# Patient Record
Sex: Female | Born: 1960 | Race: White | Hispanic: No | Marital: Married | Smoking: Former smoker
Health system: Southern US, Community
[De-identification: ages and names within clinical notes are randomized; demographics above are authoritative.]

## PROBLEM LIST (undated history)

## (undated) DIAGNOSIS — G4700 Insomnia, unspecified: Secondary | ICD-10-CM

## (undated) DIAGNOSIS — I499 Cardiac arrhythmia, unspecified: Secondary | ICD-10-CM

## (undated) DIAGNOSIS — R002 Palpitations: Secondary | ICD-10-CM

## (undated) DIAGNOSIS — J302 Other seasonal allergic rhinitis: Secondary | ICD-10-CM

## (undated) HISTORY — DX: Palpitations: R00.2

## (undated) HISTORY — PX: CHOLECYSTECTOMY: SHX55

## (undated) HISTORY — PX: TONSILLECTOMY: SUR1361

## (undated) HISTORY — PX: WISDOM TOOTH EXTRACTION: SHX21

## (undated) HISTORY — PX: OTHER SURGICAL HISTORY: SHX169

## (undated) HISTORY — DX: Cardiac arrhythmia, unspecified: I49.9

---

## 2011-07-29 DIAGNOSIS — I499 Cardiac arrhythmia, unspecified: Secondary | ICD-10-CM

## 2011-07-29 HISTORY — DX: Cardiac arrhythmia, unspecified: I49.9

## 2011-08-02 ENCOUNTER — Ambulatory Visit (INDEPENDENT_AMBULATORY_CARE_PROVIDER_SITE_OTHER): Payer: 59 | Admitting: Surgery

## 2011-08-06 ENCOUNTER — Ambulatory Visit (INDEPENDENT_AMBULATORY_CARE_PROVIDER_SITE_OTHER): Payer: 59 | Admitting: Surgery

## 2011-08-16 ENCOUNTER — Ambulatory Visit: Payer: Self-pay | Admitting: Internal Medicine

## 2011-08-17 ENCOUNTER — Emergency Department (HOSPITAL_BASED_OUTPATIENT_CLINIC_OR_DEPARTMENT_OTHER)
Admission: EM | Admit: 2011-08-17 | Discharge: 2011-08-18 | Disposition: A | Discharge status: Home / Self Care | Payer: 59 | Attending: Emergency Medicine | Admitting: Emergency Medicine

## 2011-08-17 ENCOUNTER — Emergency Department (INDEPENDENT_AMBULATORY_CARE_PROVIDER_SITE_OTHER): Payer: 59

## 2011-08-17 ENCOUNTER — Encounter (HOSPITAL_BASED_OUTPATIENT_CLINIC_OR_DEPARTMENT_OTHER): Payer: Self-pay | Admitting: Student

## 2011-08-17 DIAGNOSIS — R42 Dizziness and giddiness: Secondary | ICD-10-CM | POA: Insufficient documentation

## 2011-08-17 DIAGNOSIS — E860 Dehydration: Secondary | ICD-10-CM

## 2011-08-17 DIAGNOSIS — R002 Palpitations: Secondary | ICD-10-CM | POA: Insufficient documentation

## 2011-08-17 DIAGNOSIS — I10 Essential (primary) hypertension: Secondary | ICD-10-CM

## 2011-08-17 DIAGNOSIS — E876 Hypokalemia: Secondary | ICD-10-CM

## 2011-08-17 DIAGNOSIS — Z79899 Other long term (current) drug therapy: Secondary | ICD-10-CM | POA: Insufficient documentation

## 2011-08-17 DIAGNOSIS — R5381 Other malaise: Secondary | ICD-10-CM

## 2011-08-17 DIAGNOSIS — I951 Orthostatic hypotension: Secondary | ICD-10-CM

## 2011-08-17 DIAGNOSIS — R110 Nausea: Secondary | ICD-10-CM | POA: Insufficient documentation

## 2011-08-17 HISTORY — DX: Insomnia, unspecified: G47.00

## 2011-08-17 HISTORY — DX: Other seasonal allergic rhinitis: J30.2

## 2011-08-17 LAB — BASIC METABOLIC PANEL
BUN: 7 mg/dL (ref 6–23)
Chloride: 103 mEq/L (ref 96–112)
GFR calc Af Amer: >90 mL/min (ref >90)
GFR calc non Af Amer: >90 mL/min (ref >90)
Potassium: 3 mEq/L — ABNORMAL LOW (ref 3.5–5.1)
Sodium: 137 mEq/L (ref 135–145)

## 2011-08-17 LAB — URINALYSIS, ROUTINE W REFLEX MICROSCOPIC
Bilirubin Urine: NEGATIVE
Glucose, UA: NEGATIVE mg/dL
Hgb urine dipstick: NEGATIVE
Ketones, ur: NEGATIVE mg/dL
Leukocytes, UA: NEGATIVE
Nitrite: NEGATIVE
Protein, ur: NEGATIVE mg/dL
Specific Gravity, Urine: 1.007 (ref 1.005–1.030)
Urobilinogen, UA: 0.2 mg/dL (ref 0.0–1.0)
pH: 6 (ref 5.0–8.0)

## 2011-08-17 LAB — DIFFERENTIAL
Basophils Absolute: 0 10*3/uL (ref 0.0–0.1)
Basophils Relative: 0 % (ref 0–1)
Eosinophils Absolute: 0 10*3/uL (ref 0.0–0.7)
Eosinophils Relative: 0 % (ref 0–5)
Lymphocytes Relative: 8 % — ABNORMAL LOW (ref 12–46)
Lymphs Abs: 0.6 K/uL — ABNORMAL LOW (ref 0.7–4.0)
Monocytes Absolute: 0.5 K/uL (ref 0.1–1.0)
Monocytes Relative: 6 % (ref 3–12)
Neutro Abs: 7.2 10*3/uL (ref 1.7–7.7)
Neutrophils Relative %: 86 % — ABNORMAL HIGH (ref 43–77)

## 2011-08-17 LAB — BASIC METABOLIC PANEL WITH GFR
CO2: 24 meq/L (ref 19–32)
Calcium: 8.7 mg/dL (ref 8.4–10.5)
Creatinine, Ser: 0.5 mg/dL (ref 0.50–1.10)
Glucose, Bld: 114 mg/dL — ABNORMAL HIGH (ref 70–99)

## 2011-08-17 LAB — CBC
HCT: 44.3 % (ref 36.0–46.0)
Hemoglobin: 15.8 g/dL — ABNORMAL HIGH (ref 12.0–15.0)
MCH: 31.8 pg (ref 26.0–34.0)
MCHC: 35.7 g/dL (ref 30.0–36.0)
MCV: 89.1 fL (ref 78.0–100.0)
Platelets: 205 10*3/uL (ref 150–400)
RBC: 4.97 MIL/uL (ref 3.87–5.11)
RDW: 13.2 % (ref 11.5–15.5)
WBC: 8.3 K/uL (ref 4.0–10.5)

## 2011-08-17 LAB — TROPONIN I: Troponin I: <0.3 ng/mL (ref <0.30)

## 2011-08-17 LAB — PREGNANCY, URINE: Preg Test, Ur: NEGATIVE

## 2011-08-17 MED ORDER — ONDANSETRON HCL 4 MG/2ML IJ SOLN
4.0000 mg | Freq: Once | INTRAMUSCULAR | Status: AC
  Administered 2011-08-17: 4 mg via INTRAVENOUS
  Filled 2011-08-17: qty 2

## 2011-08-17 MED ORDER — POTASSIUM CHLORIDE IN NACL 20-0.9 MEQ/L-% IV SOLN
Freq: Once | INTRAVENOUS | Status: AC
  Administered 2011-08-17: 23:00:00 via INTRAVENOUS
  Filled 2011-08-17: qty 1000

## 2011-08-17 MED ORDER — SODIUM CHLORIDE 0.9 % IV BOLUS (SEPSIS)
1000.0000 mL | Freq: Once | INTRAVENOUS | Status: AC
  Administered 2011-08-17: 1000 mL via INTRAVENOUS

## 2011-08-17 MED ORDER — POTASSIUM CHLORIDE CRYS ER 20 MEQ PO TBCR
40.0000 meq | EXTENDED_RELEASE_TABLET | Freq: Once | ORAL | Status: AC
  Administered 2011-08-17: 40 meq via ORAL
  Filled 2011-08-17: qty 2

## 2011-08-17 NOTE — ED Notes (Signed)
MD at bedside. 

## 2011-08-17 NOTE — ED Provider Notes (Signed)
History     CSN: [id number]  Arrival date & time 08/17/11  7829   First MD Initiated Contact with Patient 08/17/11 2129      Chief Complaint  Patient presents with  . Nausea  . Palpitations  . 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 She reports that she woke up this morning with heart palpitations, occasional nausea and lightheadedness. She denies fever or chills. She denies chest pain or pleurisy. She denies sore throat, coughing. She denies any vomiting or diarrhea. She notes that she has had some irregular menstrual bleeding and thinks that she might be going through menopause. She does see a OB/GYN. She is currently on birth control pills to help regulate her regular menses. She notes 3 weeks ago she had 2 weeks of continual bleeding. This has since resolved. She reports she continues to feel lightheaded with heart palpitations currently. She denies lower extremity swelling or pain. She denies long distance travel recently. She denies prior history of heart problems. She is a nonsmoker and denies any family history of coronary disease. She does take the Zyrtec and Benadryl for a combination of seasonal allergies as well as insomnia.  Patient is a 51 y.o. female presenting with palpitations. The history is provided by the patient and the spouse.  Palpitations  Associated symptoms include nausea. Pertinent negatives include no fever, no chest pain, no vomiting, no back pain, no cough and no shortness of breath.    Past Medical History  Diagnosis Date  . Seasonal allergies   . Insomnia     Past Surgical History  Procedure Date  . Cholecystectomy     History reviewed. No pertinent family history.  History  Substance Use Topics  . Smoking status: Former Smoker    Types: Cigarettes    Quit date: 08/20/1991  . Smokeless tobacco: Never Used    . Alcohol Use: Yes    OB History    Grav Para Term Preterm Abortions TAB SAB Ect Mult Living                  Review of Systems  Constitutional: Negative.  Negative for fever and chills.  HENT: Negative for nosebleeds and congestion.   Respiratory: Negative for cough, chest tightness, shortness of breath and wheezing.   Cardiovascular: Positive for palpitations. Negative for chest pain and leg swelling.  Gastrointestinal: Positive for nausea. Negative for vomiting.  Genitourinary: Negative for flank pain.  Musculoskeletal: Negative for back pain.  Neurological: Positive for light-headedness. Negative for syncope.  All other systems reviewed and are negative.    Allergies  Penicillins  Home Medications   Current Outpatient Rx  Name Route Sig Dispense Refill  . CETIRIZINE HCL 10 MG PO TABS Oral Take 10 mg by mouth daily.    Marland Kitchen DIPHENHYDRAMINE HCL 25 MG PO TABS Oral Take 50 mg by mouth at bedtime as needed. For sleep    . LEVONORGESTREL-ETHINYL ESTRAD 0.1-20 MG-MCG PO TABS Oral Take 1 tablet by mouth daily.    . ADULT MULTIVITAMIN W/MINERALS CH Oral Take 1 tablet by mouth daily.    Marland Kitchen NAPROXEN SODIUM 220 MG PO TABS Oral Take 440 mg by mouth once as needed. For pain    . FISH OIL PO Oral Take 400 mg by mouth daily.    Marland Kitchen OVER THE COUNTER MEDICATION Oral Take 1 tablet by mouth once. antacid      BP 110/66  Pulse 92  Temp(Src) 98.6 F (37 C) (Oral)  Resp 20  Ht 5\' 6"  (1.676 m)  Wt 155 lb (70.308 kg)  BMI 25.02 kg/m2  SpO2 98%  LMP 08/03/2011  Physical Exam  Nursing note and vitals reviewed. Constitutional: She is oriented to person, place, and time. She appears well-developed and well-nourished. No distress.  HENT:  Head: Normocephalic and atraumatic.  Eyes: EOM are normal. Pupils are equal, round, and reactive to light.  Neck: Normal range of motion. Neck supple. No JVD present. No tracheal deviation present. No thyromegaly present.  Cardiovascular: Exam reveals no  gallop.   No murmur heard. Pulmonary/Chest: Effort normal. No respiratory distress. She has no wheezes. She has no rales.  Abdominal: Soft. Bowel sounds are normal.  Neurological: She is alert and oriented to person, place, and time.  Skin: Skin is warm and dry. No rash noted.  Psychiatric: She has a normal mood and affect.    ED Course  Procedures (including critical care time)  Labs Reviewed  CBC - Abnormal; Notable for the following:    Hemoglobin 15.8 (*)    All other components within normal limits  DIFFERENTIAL - Abnormal; Notable for the following:    Neutrophils Relative 86 (*)    Lymphocytes Relative 8 (*)    Lymphs Abs 0.6 (*)    All other components within normal limits  BASIC METABOLIC PANEL - Abnormal; Notable for the following:    Potassium 3.0 (*)    Glucose, Bld 114 (*)    All other components within normal limits  PREGNANCY, URINE  URINALYSIS, ROUTINE W REFLEX MICROSCOPIC  TROPONIN I  TSH   No results found.   1. Orthostasis   2. Hypokalemia   3. Dehydration     ECG at time 19:49 shows sinus tachycardia at rate 104, normal axis, normal intervals.  Non specific T wave inversions inferiorly and laterally.     12:30 AM, pt receiving IV NS with K+ and repeat EKG pending.  Pt signed out to Dr. Bebe Shaggy for re-assessment.    MDM  Pt's orthostatics are positive.  Pt did have 2 loose stools while here.  Hgb is elevated suggesting hemoconcentration and dehydration but also associated hypokalemia with K+ of 3.0.  This likely can explain her symptoms along with non specific ECG changes.  Pt had no pleurisy, CP, doubt CAD, doubt PE with neg Denna Haggard' no recent travel, RA sat of 98%.  Will replace K+ and recheck vital signs and ECG.  1 L of IVF'a already given.  Otherwise troponin and other electrolytes are WNL.  TSH was sent, can be followed up as outpt.  Pt has been set up for an outpt appt with Dr. Lenord Fellers to establish as new patient.          Gavin Pound. Rada Zegers,  MD 08/20/11 1507

## 2011-08-17 NOTE — ED Notes (Signed)
Pt in with c/o N dizziness, lightheadedness, palpitations.

## 2011-08-17 NOTE — ED Notes (Signed)
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 States she woke with nausea and a fast heart rate. Went to work but has not felt well all day. Irregular menses. Thinks her thyroid levels may be abnormal.

## 2011-08-17 NOTE — Discharge Instructions (Signed)
Hypokalemia Hypokalemia means a low potassium level in the blood.Potassium is an electrolyte that helps regulate the amount of fluid in the body. It also stimulates muscle contraction and maintains a stable acid-base balance.Most of the body's potassium is inside of cells, and only a very small amount is in the blood. Because the amount in the blood is so small, minor changes can have big effects. PREPARATION FOR TEST Testing for potassium requires taking a blood sample taken by needle from a vein in the arm. The skin is cleaned thoroughly before the sample is drawn. There is no other special preparation needed. NORMAL VALUES Potassium levels below 3.5 mEq/L are abnormally low. Levels above 5.1 mEq/L are abnormally high. Ranges for normal findings may vary among different laboratories and hospitals. You should always check with your doctor after having lab work or other tests done to discuss the meaning of your test results and whether your values are considered within normal limits. MEANING OF TEST  Your caregiver will go over the test results with you and discuss the importance and meaning of your results, as well as treatment options and the need for additional tests, if necessary. A potassium level is frequently part of a routine medical exam. It is usually included as part of a whole "panel" of tests for several blood salts (such as Sodium and Chloride). It may be done as part of follow-up when a low potassium level was found in the past or other blood salts are suspected of being out of balance. A low potassium level might be suspected if you have one or more of the following:  Symptoms of weakness.   Abnormal heart rhythms.   High blood pressure and are taking medication to control this, especially water pills (diuretics).   Kidney disease that can affect your potassium level .   Diabetes requiring the use of insulin. The potassium may fall after taking insulin, especially if the  diabetes had been out of control for a while.   A condition requiring the use of cortisone-type medication or certain types of antibiotics.   Vomiting and/or diarrhea for more than a day or two.   A stomach or intestinal condition that may not permit appropriate absorption of potassium.   Fainting episodes.   Mental confusion.  OBTAINING TEST RESULTS It is your responsibility to obtain your test results. Ask the lab or department performing the test when and how you will get your results.  Please contact your caregiver directly if you have not received the results within one week. At that time, ask if there is anything different or new you should be doing in relation to the results. TREATMENT Hypokalemia can be treated with potassium supplements taken by mouth and/or adjustments in your current medications. A diet high in potassium is also helpful. Foods with high potassium content are:  Peas, lentils, lima beans, nuts, and dried fruit.   Whole grain and bran cereals and breads.   Fresh fruit, vegetables (bananas, cantaloupe, grapefruit, oranges, tomatoes, honeydew melons, potatoes).   Orange and tomato juices.   Meats. If potassium supplement has been prescribed for you today or your medications have been adjusted, see your personal caregiver in time02 for a re-check.  SEEK MEDICAL CARE IF:  There is a feeling of worsening weakness.   You experience repeated chest palpitations.   You are diabetic and having difficulty keeping your blood sugars in the normal range.   You are experiencing vomiting and/or diarrhea.   You are having  difficulty with any of your regular medications.  SEEK IMMEDIATE MEDICAL CARE IF:  You experience chest pain, shortness of breath, or episodes of dizziness.   You have been having vomiting or diarrhea for more than 2 days.   You have a fainting episode.  MAKE SURE YOU:   Understand these instructions.   Will watch your condition.   Will  get help right away if you are not doing well or get worse.  Document Released: 04/05/2005 Document Revised: 03/25/2011 Document Reviewed: 03/16/2008 [facility] Patient Information 2012 [place], [place].     Orthostatic Hypotension Orthostatic hypotension is a sudden fall in blood pressure. It occurs when a person goes from a sitting or lying position to a standing position. CAUSES   Loss of body fluids (dehydration).   Medicines that lower blood pressure.   Sudden changes in posture, such as sudden standing when you have been sitting or lying down.   Taking too much of your medicine.  SYMPTOMS   Lightheadedness or dizziness.   Fainting or near-fainting.   A fast heart rate (tachycardia).   Weakness.   Feeling tired (fatigue).  DIAGNOSIS  Your caregiver may find the cause of orthostatic hypotension through:  A history and/or physical exam.   Checking your blood pressure. Your caregiver will check your blood pressure when you are:   Lying down.   Sitting.   Standing.   Tilt table testing. In this test, you are placed on a table that goes from a lying position to a standing position. You will be strapped to the table. This test helps to monitor your blood pressure and heart rate when you are in different positions.  TREATMENT   If orthostatic hypotension is caused by your medicines, your caregiver will need to adjust your dosage. Do not stop or adjust your medicine on your own.   When changing positions, make these changes slowly. This allows your body to adjust to the different position.   Compression stockings that are worn on your lower legs may be helpful.   Your caregiver may have you consume extra salt. Do not add extra salt to your diet unless directed by your caregiver.   Eat frequent, small meals. Avoid sudden standing after eating.   Avoid hot showers or excessive heat.   Your caregiver may give you fluids through the vein (intravenous).   Your  caregiver may put you on medicine to help enhance fluid retention.  SEEK IMMEDIATE MEDICAL CARE IF:   You faint or have a near-fainting episode. Call your local emergency services (911 in [place]   You have or develop chest pain.   You feel sick to your stomach (nauseous) or vomit.   You have a loss of feeling or movement in your arms or legs.   You have difficulty talking, slurred speech, or you are unable to talk.   You have difficulty thinking or have confused thinking.  MAKE SURE YOU:   Understand these instructions.   Will watch your condition.   Will get help right away if you are not doing well or get worse.  Document Released: 03/26/2002 Document Revised: 03/25/2011 Document Reviewed: 07/19/2008 [facility] Patient Information 2012 [place], [place].

## 2011-08-18 LAB — TSH: TSH: 0.746 u[IU]/mL (ref 0.350–4.500)

## 2011-08-18 MED ORDER — POTASSIUM CHLORIDE IN NACL 20-0.9 MEQ/L-% IV SOLN: Freq: Once | INTRAVENOUS | Status: DC

## 2011-08-18 MED ORDER — KETOROLAC TROMETHAMINE 30 MG/ML IJ SOLN
INTRAMUSCULAR | Status: AC
  Filled 2011-08-18: qty 1

## 2011-08-18 MED ORDER — KETOROLAC TROMETHAMINE 30 MG/ML IJ SOLN
30.0000 mg | Freq: Once | INTRAMUSCULAR | Status: AC
  Administered 2011-08-18: 30 mg via INTRAVENOUS

## 2011-08-18 NOTE — ED Provider Notes (Signed)
Pt improved Ambulatory, vitals improved Repeat EKG reviewed, some improvement in t waves She has no complaints Stable for d/c Labs reviewed BP 110/66  Pulse 92  Temp(Src) 98.6 F (37 C) (Oral)  Resp 20  Ht 5\' 6"  (1.676 m)  Wt 155 lb (70.308 kg)  BMI 25.02 kg/m2  SpO2 98%  LMP 08/03/2011   Joya Gaskins, MD 08/18/11 [phone]

## 2011-08-20 ENCOUNTER — Other Ambulatory Visit: Payer: Self-pay | Admitting: Internal Medicine

## 2011-08-20 ENCOUNTER — Encounter: Payer: Self-pay | Admitting: Internal Medicine

## 2011-08-20 ENCOUNTER — Ambulatory Visit (INDEPENDENT_AMBULATORY_CARE_PROVIDER_SITE_OTHER): Payer: 59 | Admitting: Internal Medicine

## 2011-08-20 VITALS — BP 116/84 | HR 72 | Temp 98.9°F | Ht 65.5 in | Wt 152.0 lb

## 2011-08-20 DIAGNOSIS — F411 Generalized anxiety disorder: Secondary | ICD-10-CM

## 2011-08-20 DIAGNOSIS — R110 Nausea: Secondary | ICD-10-CM

## 2011-08-20 DIAGNOSIS — A084 Viral intestinal infection, unspecified: Secondary | ICD-10-CM

## 2011-08-20 DIAGNOSIS — F419 Anxiety disorder, unspecified: Secondary | ICD-10-CM

## 2011-08-20 DIAGNOSIS — E876 Hypokalemia: Secondary | ICD-10-CM

## 2011-08-20 DIAGNOSIS — J309 Allergic rhinitis, unspecified: Secondary | ICD-10-CM

## 2011-08-20 DIAGNOSIS — R002 Palpitations: Secondary | ICD-10-CM

## 2011-08-20 LAB — CBC WITH DIFFERENTIAL/PLATELET
Basophils Absolute: 0 10*3/uL (ref 0.0–0.1)
Eosinophils Relative: 1 % (ref 0–5)
Lymphocytes Relative: 17 % (ref 12–46)
Lymphs Abs: 1.1 10*3/uL (ref 0.7–4.0)
Neutro Abs: 4.9 10*3/uL (ref 1.7–7.7)
Neutrophils Relative %: 75 % (ref 43–77)
Platelets: 234 10*3/uL (ref 150–400)
RBC: 4.91 MIL/uL (ref 3.87–5.11)
RDW: 13.3 % (ref 11.5–15.5)
WBC: 6.5 10*3/uL (ref 4.0–10.5)

## 2011-08-20 LAB — COMPREHENSIVE METABOLIC PANEL
ALT: 38 U/L — ABNORMAL HIGH (ref 0–35)
AST: 34 U/L (ref 0–37)
CO2: 26 mEq/L (ref 19–32)
Calcium: 9.4 mg/dL (ref 8.4–10.5)
Chloride: 104 mEq/L (ref 96–112)
Creat: 0.68 mg/dL (ref 0.50–1.10)
Sodium: 140 mEq/L (ref 135–145)
Total Bilirubin: 0.3 mg/dL (ref 0.3–1.2)
Total Protein: 6.9 g/dL (ref 6.0–8.3)

## 2011-08-20 LAB — AMYLASE: Amylase: 51 U/L (ref 0–105)

## 2011-08-20 LAB — T4, FREE: Free T4: 1.04 ng/dL (ref 0.80–1.80)

## 2011-08-20 LAB — HEMOGLOBIN A1C: Hgb A1c MFr Bld: 5.2 % (ref <5.7)

## 2011-08-23 ENCOUNTER — Encounter: Payer: Self-pay | Admitting: Internal Medicine

## 2011-08-23 ENCOUNTER — Ambulatory Visit (INDEPENDENT_AMBULATORY_CARE_PROVIDER_SITE_OTHER): Payer: 59 | Admitting: Internal Medicine

## 2011-08-23 VITALS — BP 128/86 | HR 76 | Temp 99.5°F | Wt 153.5 lb

## 2011-08-23 DIAGNOSIS — A09 Infectious gastroenteritis and colitis, unspecified: Secondary | ICD-10-CM

## 2011-08-23 DIAGNOSIS — R002 Palpitations: Secondary | ICD-10-CM

## 2011-08-23 DIAGNOSIS — D179 Benign lipomatous neoplasm, unspecified: Secondary | ICD-10-CM

## 2011-08-23 DIAGNOSIS — A084 Viral intestinal infection, unspecified: Secondary | ICD-10-CM

## 2011-08-23 LAB — H. PYLORI ANTIBODY, IGG: H Pylori IgG: 0.5 {ISR}

## 2011-08-24 ENCOUNTER — Encounter: Payer: Self-pay | Admitting: Internal Medicine

## 2011-08-24 DIAGNOSIS — R002 Palpitations: Secondary | ICD-10-CM | POA: Insufficient documentation

## 2011-08-24 DIAGNOSIS — D179 Benign lipomatous neoplasm, unspecified: Secondary | ICD-10-CM | POA: Insufficient documentation

## 2011-08-24 NOTE — Patient Instructions (Signed)
Call if symptoms recur. Otherwise return as needed.

## 2011-08-24 NOTE — Progress Notes (Signed)
  Subjective:    Patient ID: Margaret Bass, female    DOB: 1960/10/08, 51 y.o.   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 Patient says today that she had to use denies her dog recently which was very stressful. On the day she was seen in the emergency department she wakened feeling not well at all. She was sluggish all day and apparently developed a tachycardia around 120 beats per minute. She with to the emergency department and was diagnosed with hypokalemia and was given some IV fluids potassium supplementation. She went home and developed multiple episodes of diarrhea. Had nausea but no vomiting. She also has approximate 3 cm soft nodule on right lower rib cage area which she describes as needed. No other lesions like this. Only noticed it a few weeks ago. Became alarmed. She and her [place]. EKG done in the office on May 3 was normal. Also had some vague epigastric discomfort at the same time she was experiencing the palpitations.    Review of Systems     Objective:   Physical ExamChest clear to auscultation; cardiac exam regular rate and rhythm normal S1 and S2 with no murmurs clicks or friction rub. extremities without edema. 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        Assessment & Plan:Palpitations-etiology unclear  Bile gastroenteritis-resolved  Palpitations  Palpitations  Hypokalemia-etiology unclear. Repeat serum potassium on May 3 is within normal limits.  Probable viral gastroenteritis  Lipoma right anterior rib cage area  Plan: Patient is to return for reassessment of lipoma in a few weeks. I believe this is a benign lesion. If she has recurrent palpitations, we can refer her to cardiologist.  Reviewed with her recent lab work. TSH is normal. Urine pregnancy test  negative. Urinalysis was normal. Amylase, lipase, H. pylori antibody negative.

## 2011-09-18 NOTE — Progress Notes (Signed)
  Subjective:    Patient ID: Margaret Bass, female    DOB: 07/26/1960, 51 y.o.   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 Patient did not feel well at work. Began to feel very fatigued for some unknown reason. Was given IV fluids in the emergency department and sent home. Later developed gastroenteritis symptoms. CBC was within normal limits. She was afebrile in the emergency department. She was complaining of nausea and palpitations at the time. It is unclear why she was hypokalemic. Diagnosis was made before onset of diarrhea. She had no fever or shaking chills. Patient was very alarmed by all of this. In  Patient has history of allergic rhinitis. Had fractured clavicle 1979; cholecystectomy 1981; she is allergic to penicillin it causes times. She's had 3 pregnancies and no miscarriages. Take Zyrtec for allergic rhinitis. Takes Benadryl to sleep. Is on oral contraceptives. Patient had tetanus immunization in 2009. 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 Husband is employed by [employer].  Family history: Father with history of diabetes and hypertension. Mother with history of hypertension.. One brother with hypertension and one sister in good health. 3 adult children.      Review of Systems patient is very anxious about all of this and concerned about possible recurrence. Concerned about possibility of heart condition with palpitations. Doesn't understand why she was diagnosed with hypokalemia. Otherwise feeling better at present time. Initial ER visit was on 08/17/2011.     Objective:   Physical Exam HEENT exam: TMs and pharynx are clear; neck is supple without thyromegaly, adenopathy. Chest is clear to auscultation. Cardiac exam regular rate and rhythm normal S1 and S2.  Abdomen no hepatosplenomegaly masses or tenderness. Extremities without edema. EKG today shows sinus rhythm rate 68, normal EKG.        Assessment & Plan:  Recent episode of nausea and palpitations with subsequent gastroenteritis symptoms. Suspect patient was coming down with viral gastroenteritis.  Suspect palpitations were benign  Hypokalemic  diagnosed in emergency department 0n August 17 2011 with no obvious gastroenteritis symptoms at that time. Had not vomited or had diarrhea prior to being seen. Wonder if this could be an error. She does not take diuretics.  Suspect anxiety disorder. Patient seems anxious.  Plan: Repeat serum potassium today. Check thyroid functions. EKG is within normal limits. Patient return for followup in 3 days.

## 2011-09-19 DIAGNOSIS — F419 Anxiety disorder, unspecified: Secondary | ICD-10-CM | POA: Insufficient documentation

## 2011-09-19 DIAGNOSIS — J309 Allergic rhinitis, unspecified: Secondary | ICD-10-CM | POA: Insufficient documentation

## 2011-09-19 NOTE — Patient Instructions (Signed)
Stay well hydrated and return in 3 days for reevaluation. Patient reassured. She will call if symptoms return.

## 2011-09-23 ENCOUNTER — Ambulatory Visit (INDEPENDENT_AMBULATORY_CARE_PROVIDER_SITE_OTHER): Payer: 59 | Admitting: Internal Medicine

## 2011-09-23 VITALS — BP 110/84 | HR 80 | Temp 99.2°F | Wt 153.0 lb

## 2011-09-23 DIAGNOSIS — D1739 Benign lipomatous neoplasm of skin and subcutaneous tissue of other sites: Secondary | ICD-10-CM

## 2011-09-23 DIAGNOSIS — D171 Benign lipomatous neoplasm of skin and subcutaneous tissue of trunk: Secondary | ICD-10-CM

## 2011-09-23 NOTE — Patient Instructions (Signed)
Return next Monday for physical exam or sooner if necessary

## 2011-10-04 ENCOUNTER — Encounter: Payer: Self-pay | Admitting: Internal Medicine

## 2011-10-04 NOTE — Progress Notes (Signed)
  Subjective:    Patient ID: Margaret Bass, female    DOB: August 06, 1960, 51 y.o.   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 She's had no recurrence of these symptoms whatsoever. Feels well now. At last visit she showed me a 3 cm lipoma right abdomen. She is back to followup on this. It does not appear to have changed in size or consistency.   Review of Systems     Objective:   Physical Exam 3 cm lipoma right anterior abdominal wall        Assessment & Plan:  Lipoma-continue to observe for change in size  Plan: Return as needed

## 2012-11-07 ENCOUNTER — Ambulatory Visit: Payer: Self-pay | Admitting: Nurse Practitioner

## 2012-11-15 ENCOUNTER — Other Ambulatory Visit: Payer: Self-pay | Admitting: Internal Medicine

## 2012-11-15 LAB — COMPREHENSIVE METABOLIC PANEL
CO2: 24 mEq/L (ref 19–32)
Calcium: 9.3 mg/dL (ref 8.4–10.5)
Chloride: 102 mEq/L (ref 96–112)
Creat: 0.6 mg/dL (ref 0.50–1.10)
Glucose, Bld: 84 mg/dL (ref 70–99)
Total Bilirubin: 0.6 mg/dL (ref 0.3–1.2)

## 2012-11-15 LAB — LIPID PANEL
Cholesterol: 177 mg/dL (ref 0–200)
Total CHOL/HDL Ratio: 3.8 Ratio
Triglycerides: 182 mg/dL — ABNORMAL HIGH (ref <150)
VLDL: 36 mg/dL (ref 0–40)

## 2012-11-15 LAB — CBC WITH DIFFERENTIAL/PLATELET
Eosinophils Absolute: 0.1 10*3/uL (ref 0.0–0.7)
Eosinophils Relative: 1 % (ref 0–5)
HCT: 42.2 % (ref 36.0–46.0)
Hemoglobin: 14.9 g/dL (ref 12.0–15.0)
Lymphs Abs: 1.6 10*3/uL (ref 0.7–4.0)
MCH: 31.6 pg (ref 26.0–34.0)
MCV: 89.4 fL (ref 78.0–100.0)
Monocytes Absolute: 0.6 10*3/uL (ref 0.1–1.0)
Monocytes Relative: 8 % (ref 3–12)
RBC: 4.72 MIL/uL (ref 3.87–5.11)

## 2012-11-23 ENCOUNTER — Other Ambulatory Visit: Payer: Self-pay | Admitting: Internal Medicine

## 2012-11-24 ENCOUNTER — Other Ambulatory Visit: Payer: Self-pay | Admitting: Internal Medicine

## 2012-11-27 ENCOUNTER — Encounter: Payer: Self-pay | Admitting: Internal Medicine

## 2012-11-27 ENCOUNTER — Ambulatory Visit (INDEPENDENT_AMBULATORY_CARE_PROVIDER_SITE_OTHER): Payer: 59 | Admitting: Internal Medicine

## 2012-11-27 ENCOUNTER — Other Ambulatory Visit (HOSPITAL_COMMUNITY)
Admission: RE | Admit: 2012-11-27 | Discharge: 2012-11-27 | Disposition: A | Discharge status: Home / Self Care | Payer: 59 | Source: Ambulatory Visit | Attending: Internal Medicine | Admitting: Internal Medicine

## 2012-11-27 VITALS — BP 138/88 | HR 80 | Temp 99.1°F | Ht 65.5 in | Wt 159.5 lb

## 2012-11-27 DIAGNOSIS — N912 Amenorrhea, unspecified: Secondary | ICD-10-CM

## 2012-11-27 DIAGNOSIS — N951 Menopausal and female climacteric states: Secondary | ICD-10-CM

## 2012-11-27 DIAGNOSIS — J309 Allergic rhinitis, unspecified: Secondary | ICD-10-CM

## 2012-11-27 DIAGNOSIS — Z0000 Encounter for general adult medical examination without abnormal findings: Secondary | ICD-10-CM

## 2012-11-27 DIAGNOSIS — Z01419 Encounter for gynecological examination (general) (routine) without abnormal findings: Secondary | ICD-10-CM | POA: Insufficient documentation

## 2012-11-27 LAB — POCT URINALYSIS DIPSTICK
Bilirubin, UA: NEGATIVE
Blood, UA: NEGATIVE
Glucose, UA: NEGATIVE
Nitrite, UA: NEGATIVE
Spec Grav, UA: 1.01
Urobilinogen, UA: NEGATIVE

## 2012-11-28 LAB — FOLLICLE STIMULATING HORMONE: FSH: 2.4 m[IU]/mL

## 2012-12-18 ENCOUNTER — Encounter: Payer: Self-pay | Admitting: Internal Medicine

## 2012-12-18 NOTE — Progress Notes (Signed)
  Subjective:    Patient ID: Margaret Bass, female    DOB: [birth year], 52 y.o.   MRN: [id number]  HPI    52 year old white female in today for health maintenance and evaluation of medical problems. 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 She and her husband moved here from [place] in 2013.  Social history: Married. Husband is employed at [employer]. 3 adult children.  Family history: Father with history of diabetes and hypertension. Mother with history of hypertension. One brother with hypertension. One sister in good health.  Past medical history: History of allergic rhinitis. History of fractured clavicle 1979, cholecystectomy 1981. She's had 3 pregnancies and no miscarriages.  Is allergic to penicillin that causes hives.  Medications: Takes Zyrtec for allergic rhinitis. Takes Benadryl to sleep. Is on oral contraceptives.  Had tetanus immunization in 2009.    Review of Systems  Constitutional: Negative.   HENT: Negative.   Eyes: Negative.   Respiratory: Negative.   Cardiovascular: Negative.   Gastrointestinal: Negative.   Endocrine: Negative.   Genitourinary: Negative.   Allergic/Immunologic: Positive for environmental allergies.  Neurological: Negative.   Hematological: Negative.   Psychiatric/Behavioral: Negative.        Objective:   Physical Exam  Vitals reviewed. Constitutional: She is oriented to person, place, and time. She appears well-developed and well-nourished. No distress.  HENT:  Head: Normocephalic.  Right Ear: External ear normal.  Left Ear: External ear normal.  Mouth/Throat: Oropharynx is clear and moist. No oropharyngeal exudate.  Eyes: Conjunctivae and EOM are normal. Pupils are equal, round, and reactive to light. Right eye exhibits no discharge. Left eye exhibits no discharge. No scleral icterus.  Neck: Neck supple. No JVD present. No thyromegaly present.  Cardiovascular: Normal rate, regular rhythm and normal heart  sounds.   No murmur heard. Pulmonary/Chest: Effort normal and breath sounds normal. No respiratory distress. She has no wheezes. She has no rales.  Abdominal: She exhibits no distension and no mass. There is no tenderness. There is no rebound and no guarding.  Musculoskeletal: Normal range of motion. She exhibits no edema.  Neurological: She is alert and oriented to person, place, and time. No cranial nerve deficit. Coordination normal.  Skin: Skin is warm and dry. No rash noted. She is not diaphoretic.  Psychiatric: She has a normal mood and affect. Her behavior is normal. Judgment and thought content normal.          Assessment & Plan:  Normal health maintenance exam  History of allergic rhinitis  Has had some amenorrhea. Is on oral contraceptives. Could be Perimenopausal.  FSH done.  Plan: Return in one year or as needed. Spoke with patient about screening colonoscopy.

## 2012-12-18 NOTE — Patient Instructions (Addendum)
Return in one year or as needed. Consider colonoscopy.

## 2013-01-25 ENCOUNTER — Other Ambulatory Visit: Payer: Self-pay

## 2013-01-25 MED ORDER — NORETHIN-ETH ESTRAD-FE BIPHAS 1 MG-10 MCG / 10 MCG PO TABS: 1.0000 | ORAL_TABLET | Freq: Every day | ORAL | Status: DC

## 2013-03-22 ENCOUNTER — Encounter: Payer: Self-pay | Admitting: Internal Medicine

## 2013-03-22 ENCOUNTER — Ambulatory Visit (INDEPENDENT_AMBULATORY_CARE_PROVIDER_SITE_OTHER): Payer: 59 | Admitting: Internal Medicine

## 2013-03-22 VITALS — BP 130/84 | HR 68 | Temp 99.1°F | Ht 65.0 in | Wt 160.0 lb

## 2013-03-22 DIAGNOSIS — M5430 Sciatica, unspecified side: Secondary | ICD-10-CM

## 2013-03-22 DIAGNOSIS — M5431 Sciatica, right side: Secondary | ICD-10-CM

## 2013-03-22 MED ORDER — VALACYCLOVIR HCL 500 MG PO TABS: ORAL_TABLET | ORAL | Status: DC

## 2013-03-22 MED ORDER — CYCLOBENZAPRINE HCL 10 MG PO TABS: ORAL_TABLET | ORAL | Status: DC

## 2013-03-30 ENCOUNTER — Telehealth: Payer: Self-pay | Admitting: Internal Medicine

## 2013-03-30 ENCOUNTER — Other Ambulatory Visit: Payer: Self-pay | Admitting: *Deleted

## 2013-03-30 ENCOUNTER — Encounter: Payer: Self-pay | Admitting: Internal Medicine

## 2013-03-30 DIAGNOSIS — M5430 Sciatica, unspecified side: Secondary | ICD-10-CM

## 2013-03-30 MED ORDER — METHYLPREDNISOLONE 4 MG PO TABS: ORAL_TABLET | ORAL | Status: DC

## 2013-03-30 NOTE — Progress Notes (Signed)
   Subjective:    Patient ID: Margaret Bass, female    DOB: 10-12-1960, 52 y.o.   MRN: [id number]  HPI Patient in today complaining of back pain radiating down into the right posterior upper thigh area. Says she's had this before. Doesn't know how she strained her back. Says Celebrex as worked well in the past but she does not have any at the present time. Talked briefly about placing her on oral steroids. Doesn't think she needs steroids she says.    Review of Systems     Objective:   Physical Exam Straight leg raising is negative at 90 bilaterally. Deep tendon reflexes 2+ and symmetrical in the lower extremities bilaterally. Muscle strength is normal in the lower extremities bilaterally.       Assessment & Plan:  Right sciatica  Plan: I have samples of Celebrex 200 mg daily for her to take for 2 weeks. Call if symptoms persist.  20 minutes spent with patient

## 2013-03-30 NOTE — Patient Instructions (Signed)
Takes Celebrex 200 mg daily. May need to have physical therapy. Call if symptoms persist.

## 2013-03-30 NOTE — Telephone Encounter (Signed)
Patient called saying back pain had not improved on Celebrex. Wants to try steroids.: Medrol 4 mg 6 day dosepak. Must not take Celebrex at the same time she is taking Medrol. If not getting better after course of Medrol, will need return office visit.

## 2014-01-16 ENCOUNTER — Other Ambulatory Visit: Payer: Self-pay | Admitting: Internal Medicine

## 2014-01-16 NOTE — Telephone Encounter (Signed)
Pt has not had CPE since August 2014. Needs PE torefill OCP. We can give her a couple of months IF she will book CPE

## 2014-01-16 NOTE — Telephone Encounter (Signed)
Spoke with patient regarding her OCP refill.  She scheduled a CPE and rx sent to pharmacy.

## 2014-02-14 ENCOUNTER — Encounter: Payer: Self-pay | Admitting: Internal Medicine

## 2014-02-20 ENCOUNTER — Encounter: Payer: Self-pay | Admitting: Internal Medicine

## 2014-03-26 ENCOUNTER — Encounter: Payer: Self-pay | Admitting: Internal Medicine

## 2014-03-29 ENCOUNTER — Other Ambulatory Visit: Payer: Self-pay | Admitting: Internal Medicine

## 2014-03-29 ENCOUNTER — Other Ambulatory Visit: Payer: 59 | Admitting: Internal Medicine

## 2014-03-29 DIAGNOSIS — Z1329 Encounter for screening for other suspected endocrine disorder: Secondary | ICD-10-CM

## 2014-03-29 DIAGNOSIS — Z1321 Encounter for screening for nutritional disorder: Secondary | ICD-10-CM

## 2014-03-29 DIAGNOSIS — Z130 Encounter for screening for diseases of the blood and blood-forming organs and certain disorders involving the immune mechanism: Secondary | ICD-10-CM

## 2014-03-29 DIAGNOSIS — Z0000 Encounter for general adult medical examination without abnormal findings: Secondary | ICD-10-CM

## 2014-03-29 DIAGNOSIS — Z13220 Encounter for screening for lipoid disorders: Secondary | ICD-10-CM

## 2014-03-29 LAB — COMPREHENSIVE METABOLIC PANEL
ALK PHOS: 47 U/L (ref 39–117)
ALT: 13 U/L (ref 0–35)
AST: 19 U/L (ref 0–37)
Albumin: 4.1 g/dL (ref 3.5–5.2)
BILIRUBIN TOTAL: 0.4 mg/dL (ref 0.2–1.2)
BUN: 10 mg/dL (ref 6–23)
CO2: 17 mEq/L — ABNORMAL LOW (ref 19–32)
CREATININE: 0.71 mg/dL (ref 0.50–1.10)
Calcium: 9.2 mg/dL (ref 8.4–10.5)
Chloride: 105 mEq/L (ref 96–112)
Glucose, Bld: 78 mg/dL (ref 70–99)
Potassium: 4.3 mEq/L (ref 3.5–5.3)
Sodium: 138 mEq/L (ref 135–145)
Total Protein: 6.6 g/dL (ref 6.0–8.3)

## 2014-03-29 LAB — CBC WITH DIFFERENTIAL/PLATELET
BASOS PCT: 0 % (ref 0–1)
Basophils Absolute: 0 10*3/uL (ref 0.0–0.1)
EOS ABS: 0.1 10*3/uL (ref 0.0–0.7)
EOS PCT: 2 % (ref 0–5)
HCT: 41.7 % (ref 36.0–46.0)
HEMOGLOBIN: 14.5 g/dL (ref 12.0–15.0)
LYMPHS ABS: 1.7 10*3/uL (ref 0.7–4.0)
Lymphocytes Relative: 26 % (ref 12–46)
MCH: 30.7 pg (ref 26.0–34.0)
MCHC: 34.8 g/dL (ref 30.0–36.0)
MCV: 88.3 fL (ref 78.0–100.0)
MPV: 12.3 fL (ref 9.4–12.4)
Monocytes Absolute: 0.6 10*3/uL (ref 0.1–1.0)
Monocytes Relative: 9 % (ref 3–12)
Neutro Abs: 4.1 10*3/uL (ref 1.7–7.7)
Neutrophils Relative %: 63 % (ref 43–77)
Platelets: 264 10*3/uL (ref 150–400)
RBC: 4.72 MIL/uL (ref 3.87–5.11)
RDW: 13.8 % (ref 11.5–15.5)
WBC: 6.5 10*3/uL (ref 4.0–10.5)

## 2014-03-29 LAB — LIPID PANEL
CHOL/HDL RATIO: 3.3 ratio
CHOLESTEROL: 171 mg/dL (ref 0–200)
HDL: 52 mg/dL (ref >39)
LDL CALC: 95 mg/dL (ref 0–99)
TRIGLYCERIDES: 122 mg/dL (ref <150)
VLDL: 24 mg/dL (ref 0–40)

## 2014-03-29 LAB — TSH: TSH: 0.927 u[IU]/mL (ref 0.350–4.500)

## 2014-03-30 LAB — VITAMIN D 25 HYDROXY (VIT D DEFICIENCY, FRACTURES): VIT D 25 HYDROXY: 36 ng/mL (ref 30–100)

## 2014-04-01 ENCOUNTER — Encounter: Payer: Self-pay | Admitting: Internal Medicine

## 2014-04-01 ENCOUNTER — Telehealth: Payer: Self-pay

## 2014-04-01 ENCOUNTER — Ambulatory Visit (INDEPENDENT_AMBULATORY_CARE_PROVIDER_SITE_OTHER): Payer: 59 | Admitting: Internal Medicine

## 2014-04-01 VITALS — BP 148/84 | HR 84 | Temp 98.7°F | Ht 65.0 in | Wt 160.0 lb

## 2014-04-01 DIAGNOSIS — R002 Palpitations: Secondary | ICD-10-CM

## 2014-04-01 DIAGNOSIS — N951 Menopausal and female climacteric states: Secondary | ICD-10-CM | POA: Diagnosis not present

## 2014-04-01 DIAGNOSIS — Z0000 Encounter for general adult medical examination without abnormal findings: Secondary | ICD-10-CM | POA: Diagnosis not present

## 2014-04-01 DIAGNOSIS — IMO0001 Reserved for inherently not codable concepts without codable children: Secondary | ICD-10-CM

## 2014-04-01 DIAGNOSIS — R030 Elevated blood-pressure reading, without diagnosis of hypertension: Secondary | ICD-10-CM | POA: Diagnosis not present

## 2014-04-01 LAB — POCT URINALYSIS DIPSTICK
BILIRUBIN UA: NEGATIVE
Blood, UA: NEGATIVE
Glucose, UA: NEGATIVE
KETONES UA: NEGATIVE
LEUKOCYTES UA: NEGATIVE
NITRITE UA: NEGATIVE
Protein, UA: NEGATIVE
Spec Grav, UA: 1.01
Urobilinogen, UA: NEGATIVE
pH, UA: 6.5

## 2014-04-01 MED ORDER — NORETHIN-ETH ESTRAD-FE BIPHAS 1 MG-10 MCG / 10 MCG PO TABS: 1.0000 | ORAL_TABLET | Freq: Every day | ORAL | Status: DC

## 2014-04-01 NOTE — Progress Notes (Signed)
Subjective:    Patient ID: Margaret Bass, female    DOB: 1961-02-08, 53 y.o.   MRN: [id number]  HPI  53 year old White Female in today for health maintenance exam. Has lipoma right trunk which has not changed in size and is not painful. She tells me her blood pressure has been up at times in the 140 range but not consistently. She is on oral contraceptives to manage heavy menses. She tried coming off about 6 months ago and had bad cramps and heavy bleeding. Sometimes she does skip a period despite taking oral contraceptives. Suspect she is perimenopausal. Sometimes feels warm at night. She is to purchase a home blood pressure cuff and keep track of her blood pressure. Occasionally at night will have palpitations but realizes it's related to either caffeine or decongestant consumption.  Past medical history: History of allergic rhinitis. History of fractured clavicle 1979. Cholecystectomy 1981. 3 pregnancies and no miscarriages.  Social history: Married. Husband is employed by [employer]. 3 adult children. 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 She'll her husband moved here from [place] in 2013.  Family history: Father with history of diabetes and hypertension. Mother with history of hypertension. One brother with hypertension. One sister in good health.  Is allergic to penicillin-causes hives.  Medications: Takes Zyrtec for allergic rhinitis and Benadryl to sleep. Is on oral contraceptives for heavy menses.  Had tetanus immunization in 2009.    Review of Systems  Constitutional: Negative.   HENT: Negative.   Respiratory: Negative.   Cardiovascular: Positive for palpitations.  Endocrine: Negative.   Allergic/Immunologic: Positive for environmental allergies.  Neurological: Negative.   Hematological: Negative.        Objective:   Physical Exam  Constitutional: She is oriented to person, place, and time. She appears well-developed and well-nourished.  No distress.  HENT:  Head: Normocephalic and atraumatic.  Right Ear: External ear normal.  Left Ear: External ear normal.  Mouth/Throat: Oropharynx is clear and moist. No oropharyngeal exudate.  Eyes: Conjunctivae and EOM are normal. Pupils are equal, round, and reactive to light. Right eye exhibits no discharge. Left eye exhibits no discharge. No scleral icterus.  Neck: Neck supple. No JVD present. No thyromegaly present.  Cardiovascular: Normal rate, regular rhythm, normal heart sounds and intact distal pulses.   No murmur heard. Pulmonary/Chest: Effort normal and breath sounds normal. No respiratory distress. She has no rales. She exhibits no tenderness.  Abdominal: Bowel sounds are normal. She exhibits no distension and no mass. There is no rebound and no guarding.  Genitourinary:  Pap taken last year. Bimanual normal.  Musculoskeletal: Normal range of motion. She exhibits no edema or tenderness.  Lymphadenopathy:    She has no cervical adenopathy.  Neurological: She is alert and oriented to person, place, and time. She has normal reflexes. No cranial nerve deficit.  Skin: Skin is warm and dry. No rash noted. She is not diaphoretic.  Psychiatric: She has a normal mood and affect. Her behavior is normal. Judgment and thought content normal.  Vitals reviewed.         Assessment & Plan:  Normal health maintenance exam  By history, sometimes has elevated blood pressure and family history in her mother. Is to purchase home blood pressure monitor and watch blood pressure at home. Call if persistently elevated  Probable perimenopause-check FSH. Continue Loestrin and return in one year.  Palpitations-related to caffeine or decongestant consumption  Lipoma right trunk-benign  Plan: Continue  same medications and return in one year or as needed. [place] checked at her request. Notify me if blood pressure remains elevated.

## 2014-04-01 NOTE — Telephone Encounter (Signed)
Call to [facility] to add [facility].

## 2014-04-01 NOTE — Patient Instructions (Signed)
Watch blood pressure at home and return in one year or as needed. Continue low-dose oral contraceptives. FSH is been added.

## 2014-04-02 LAB — FOLLICLE STIMULATING HORMONE

## 2014-04-05 ENCOUNTER — Other Ambulatory Visit: Payer: 59 | Admitting: Internal Medicine

## 2014-04-05 ENCOUNTER — Ambulatory Visit (AMBULATORY_SURGERY_CENTER): Payer: Self-pay

## 2014-04-05 VITALS — Ht 66.0 in | Wt 160.4 lb

## 2014-04-05 DIAGNOSIS — Z1211 Encounter for screening for malignant neoplasm of colon: Secondary | ICD-10-CM

## 2014-04-05 DIAGNOSIS — Z780 Asymptomatic menopausal state: Secondary | ICD-10-CM

## 2014-04-05 NOTE — Progress Notes (Signed)
Per pt, no allergies to soy or egg products.Pt not taking any weight loss meds or using  O2 at home. 

## 2014-04-06 LAB — FOLLICLE STIMULATING HORMONE: FSH: 18 m[IU]/mL

## 2014-04-08 ENCOUNTER — Other Ambulatory Visit: Payer: Self-pay | Admitting: Internal Medicine

## 2014-04-08 ENCOUNTER — Telehealth: Payer: Self-pay | Admitting: *Deleted

## 2014-04-08 NOTE — Telephone Encounter (Signed)
Reviewed lab results with patient.

## 2014-04-15 ENCOUNTER — Encounter: Payer: Self-pay | Admitting: Internal Medicine

## 2014-04-26 ENCOUNTER — Ambulatory Visit (AMBULATORY_SURGERY_CENTER): Payer: 59 | Admitting: Internal Medicine

## 2014-04-26 ENCOUNTER — Encounter: Payer: Self-pay | Admitting: Internal Medicine

## 2014-04-26 VITALS — BP 129/77 | HR 66 | Temp 98.3°F | Resp 24 | Ht 66.0 in | Wt 160.0 lb

## 2014-04-26 DIAGNOSIS — Z1211 Encounter for screening for malignant neoplasm of colon: Secondary | ICD-10-CM

## 2014-04-26 MED ORDER — SODIUM CHLORIDE 0.9 % IV SOLN: 500.0000 mL | INTRAVENOUS | Status: DC

## 2014-04-26 NOTE — Progress Notes (Signed)
Patient has irregular heart-rate.  She does EKG's for a living, and was concerned.  No SOB noted, no chest pain.  Patient states that she can feel her heart-rate skipping.  Dr. Olevia Perches is aware of the EKG strip, and stated that "There is nothing wrong with the strip." Patient was taken off the monitor and admitted to unit.

## 2014-04-26 NOTE — Progress Notes (Signed)
Report to PACU, RN, vss, BBS= Clear.  

## 2014-04-26 NOTE — Patient Instructions (Signed)
Discharge instructions given. Handouts on diverticulosis and a high fiber diet. Resume previous medications. YOU HAD AN ENDOSCOPIC PROCEDURE TODAY AT THE [facility]: Refer to the procedure report that was given to you for any specific questions about what was found during the examination.  If the procedure report does not answer your questions, please call your gastroenterologist to clarify.  If you requested that your care partner not be given the details of your procedure findings, then the procedure report has been included in a sealed envelope for you to review at your convenience later.  YOU SHOULD EXPECT: Some feelings of bloating in the abdomen. Passage of more gas than usual.  Walking can help get rid of the air that was put into your GI tract during the procedure and reduce the bloating. If you had a lower endoscopy (such as a colonoscopy or flexible sigmoidoscopy) you may notice spotting of blood in your stool or on the toilet paper. If you underwent a bowel prep for your procedure, then you may not have a normal bowel movement for a few days.  DIET: Your first meal following the procedure should be a light meal and then it is ok to progress to your normal diet.  A half-sandwich or bowl of soup is an example of a good first meal.  Heavy or fried foods are harder to digest and may make you feel nauseous or bloated.  Likewise meals heavy in dairy and vegetables can cause extra gas to form and this can also increase the bloating.  Drink plenty of fluids but you should avoid alcoholic beverages for 24 hours.  ACTIVITY: Your care partner should take you home directly after the procedure.  You should plan to take it easy, moving slowly for the rest of the day.  You can resume normal activity the day after the procedure however you should NOT DRIVE or use heavy machinery for 24 hours (because of the sedation medicines used during the test).    SYMPTOMS TO REPORT IMMEDIATELY: A  gastroenterologist can be reached at any hour.  During normal business hours, 8:30 AM to 5:00 PM Monday through Friday, call [phone].  After hours and on weekends, please call the GI answering service at [phone] who will take a message and have the physician on call contact you.   Following lower endoscopy (colonoscopy or flexible sigmoidoscopy):  Excessive amounts of blood in the stool  Significant tenderness or worsening of abdominal pains  Swelling of the abdomen that is new, acute  Fever of 100F or higher  FOLLOW UP: If any biopsies were taken you will be contacted by phone or by letter within the next 1-3 weeks.  Call your gastroenterologist if you have not heard about the biopsies in 3 weeks.  Our staff will call the home number listed on your records the next business day following your procedure to check on you and address any questions or concerns that you may have at that time regarding the information given to you following your procedure. This is a courtesy call and so if there is no answer at the home number and we have not heard from you through the emergency physician on call, we will assume that you have returned to your regular daily activities without incident.  SIGNATURES/CONFIDENTIALITY: You and/or your care partner have signed paperwork which will be entered into your electronic medical record.  These signatures attest to the fact that that the information above on your After Visit Summary   has been reviewed and is understood.  Full responsibility of the confidentiality of this discharge information lies with you and/or your care-partner. 

## 2014-04-26 NOTE — Op Note (Signed)
[place]  [address] [place], [id number]   COLONOSCOPY PROCEDURE REPORT  PATIENT: Margaret, Bass  MR#: [id number] BIRTHDATE: [birth year] , 41  yrs. old GENDER: female ENDOSCOPIST: Lafayette Dragon, MD REFERRED EN:MM[facility] Parke Simmers, M.D. PROCEDURE DATE:  04/26/2014 PROCEDURE:   Colonoscopy, screening First Screening Colonoscopy - Avg.  risk and is 50 yrs.  old or older Yes.  Prior Negative Screening - Now for repeat screening. N/A  History of Adenoma - Now for follow-up colonoscopy & has been > or = to 3 yrs.  N/A  Polyps Removed Today? No. ASA CLASS:   Class I INDICATIONS:average risk for colon cancer. MEDICATIONS: Monitored anesthesia care and Propofol 300 mg IV  DESCRIPTION OF PROCEDURE:   After the risks benefits and alternatives of the procedure were thoroughly explained, informed consent was obtained.  The digital rectal exam revealed no abnormalities of the rectum.   The LB [id number]  endoscope was introduced through the anus and advanced to the cecum, which was identified by both the appendix and ileocecal valve. No adverse events experienced.   The quality of the prep was good, using MoviPrep  The instrument was then slowly withdrawn as the colon was fully examined.      COLON FINDINGS: There was mild diverticulosis noted in the sigmoid colon with associated angulation.  Retroflexed views revealed no abnormalities. The time to cecum=8 minutes 57 seconds.  Withdrawal time=7 minutes 12 seconds.  The scope was withdrawn and the procedure completed. COMPLICATIONS: There were no immediate complications.  ENDOSCOPIC IMPRESSION: There was mild diverticulosis noted in the sigmoid colon  RECOMMENDATIONS: High fiber diet Recall colonoscopy in 10 years  eSigned:  Lafayette Dragon, MD 04/26/2014 9:33 AM   cc:

## 2014-04-29 ENCOUNTER — Telehealth: Payer: Self-pay

## 2014-04-29 ENCOUNTER — Ambulatory Visit (INDEPENDENT_AMBULATORY_CARE_PROVIDER_SITE_OTHER): Payer: 59 | Admitting: Internal Medicine

## 2014-04-29 VITALS — BP 124/78 | HR 75 | Temp 98.2°F | Wt 162.0 lb

## 2014-04-29 DIAGNOSIS — I499 Cardiac arrhythmia, unspecified: Secondary | ICD-10-CM

## 2014-04-29 NOTE — Telephone Encounter (Signed)
Left a message at [phone] for the pt to call us back if any questions or concerns. maw

## 2014-04-30 ENCOUNTER — Encounter: Payer: Self-pay | Admitting: Internal Medicine

## 2014-05-18 NOTE — Progress Notes (Signed)
   Subjective:    Patient ID: Margaret Bass, female    DOB: 1961/01/13, 54 y.o.   MRN: [id number]  HPI  54 year old Female recently had colonoscopy and said she felt her heart skipping. She had no shortness of breath or chest pain.Marland Kitchen She was quite anxious before the procedure.   Short rhythm strip was printed out. Patient appeared to have occasional PACs based on this strip. Rate was 90. Procedure was done without difficulty. Colonoscopy showed mild diverticulosis with 10 year follow-up recommended. Medications include Loestrin oral contraceptives, Flonase, Zyrtec.  Now here for evaluation.  Patient complained of palpitations when she was here in December 2015. It was felt this was related to caffeine consumption and decongestant use.    Review of Systems     Objective:   Physical Exam Pulse ox today is 97%. Pulse 75 and regular. Blood pressure 124/78  Neck is supple without JVD thyromegaly or carotid bruits. Chest clear to auscultation. Cardiac exam regular rate and rhythm normal S1 and S2. Extremities without edema     Assessment & Plan:  Palpitations  Premature atrial contractions on rhythm strip  Symptoms likely due to anxiety prior to colonoscopy  Plan: Reassure patient. She'll let me know if symptoms recur.  25 minutes spent with patient including evaluation, coordination of care, counseling and review EKG

## 2014-05-19 ENCOUNTER — Encounter: Payer: Self-pay | Admitting: Internal Medicine

## 2014-05-19 NOTE — Patient Instructions (Signed)
Believe that patient's symptoms of palpitations and EKG PACs were related to stress and anticipation of colonoscopy. Patient will let us know if symptoms persist. If so, consider beta blocker or cardiology evaluation

## 2014-07-01 ENCOUNTER — Encounter: Payer: Self-pay | Admitting: Internal Medicine

## 2014-08-17 ENCOUNTER — Encounter (HOSPITAL_COMMUNITY): Payer: Self-pay

## 2014-08-17 ENCOUNTER — Inpatient Hospital Stay (HOSPITAL_COMMUNITY)
Admission: AD | Admit: 2014-08-17 | Discharge: 2014-08-17 | Disposition: A | Discharge status: Home / Self Care | Payer: 59 | Source: Ambulatory Visit | Attending: Family Medicine | Admitting: Family Medicine

## 2014-08-17 DIAGNOSIS — N939 Abnormal uterine and vaginal bleeding, unspecified: Secondary | ICD-10-CM | POA: Diagnosis not present

## 2014-08-17 DIAGNOSIS — Z87891 Personal history of nicotine dependence: Secondary | ICD-10-CM | POA: Insufficient documentation

## 2014-08-17 LAB — CBC WITH DIFFERENTIAL/PLATELET
Basophils Absolute: 0 10*3/uL (ref 0.0–0.1)
Basophils Relative: 0 % (ref 0–1)
EOS PCT: 1 % (ref 0–5)
Eosinophils Absolute: 0.1 10*3/uL (ref 0.0–0.7)
HEMATOCRIT: 38.8 % (ref 36.0–46.0)
HEMOGLOBIN: 13.4 g/dL (ref 12.0–15.0)
LYMPHS PCT: 22 % (ref 12–46)
Lymphs Abs: 2.3 10*3/uL (ref 0.7–4.0)
MCH: 31.8 pg (ref 26.0–34.0)
MCHC: 34.5 g/dL (ref 30.0–36.0)
MCV: 91.9 fL (ref 78.0–100.0)
MONO ABS: 0.9 10*3/uL (ref 0.1–1.0)
MONOS PCT: 8 % (ref 3–12)
Neutro Abs: 7.1 10*3/uL (ref 1.7–7.7)
Neutrophils Relative %: 69 % (ref 43–77)
Platelets: 246 10*3/uL (ref 150–400)
RBC: 4.22 MIL/uL (ref 3.87–5.11)
RDW: 13 % (ref 11.5–15.5)
WBC: 10.3 10*3/uL (ref 4.0–10.5)

## 2014-08-17 LAB — URINALYSIS, ROUTINE W REFLEX MICROSCOPIC
Bilirubin Urine: NEGATIVE
Glucose, UA: NEGATIVE mg/dL
Ketones, ur: NEGATIVE mg/dL
Leukocytes, UA: NEGATIVE
NITRITE: NEGATIVE
Protein, ur: 30 mg/dL — AB
Specific Gravity, Urine: 1.01 (ref 1.005–1.030)
UROBILINOGEN UA: 0.2 mg/dL (ref 0.0–1.0)
pH: 6.5 (ref 5.0–8.0)

## 2014-08-17 LAB — URINE MICROSCOPIC-ADD ON

## 2014-08-17 LAB — POCT PREGNANCY, URINE: Preg Test, Ur: NEGATIVE

## 2014-08-17 MED ORDER — FERROUS SULFATE 325 (65 FE) MG PO TABS: 325.0000 mg | ORAL_TABLET | Freq: Every day | ORAL | Status: DC

## 2014-08-17 MED ORDER — MEGESTROL ACETATE 40 MG PO TABS
ORAL_TABLET | ORAL | Status: DC
Start: 2014-08-17 — End: 2014-09-13

## 2014-08-17 NOTE — MAU Note (Signed)
Had period for 10 days, last night and today was heavy like filling up a tampon and pad in 15 min.  Having clots quarter size and 1 or 2 that were golf ball size.  Used a pad and tampon in the last 30 min.

## 2014-08-17 NOTE — Discharge Instructions (Signed)
Endometrial Biopsy Endometrial biopsy is a procedure in which a tissue sample is taken from inside the uterus. The tissue sample is then looked at under a microscope to see if the tissue is normal or abnormal. The endometrium is the lining of the uterus. This procedure helps determine where you are in your menstrual cycle and how hormone levels are affecting the lining of the uterus. This procedure may also be used to evaluate uterine bleeding or to diagnose endometrial cancer, tuberculosis, polyps, or inflammatory conditions.  LET [facility] CARE PROVIDER KNOW ABOUT:  Any allergies you have.  All medicines you are taking, including vitamins, herbs, eye drops, creams, and over-the-counter medicines.  Previous problems you or members of your family have had with the use of anesthetics.  Any blood disorders you have.  Previous surgeries you have had.  Medical conditions you have.  Possibility of pregnancy. RISKS AND COMPLICATIONS Generally, this is a safe procedure. However, as with any procedure, complications can occur. Possible complications include:  Bleeding.  Pelvic infection.  Puncture of the uterine wall with the biopsy device (rare). BEFORE THE PROCEDURE   Keep a record of your menstrual cycles as directed by your health care provider. You may need to schedule your procedure for a specific time in your cycle.  You may want to bring a sanitary pad to wear home after the procedure.  Arrange for someone to drive you home after the procedure if you will be given a medicine to help you relax (sedative). PROCEDURE   You may be given a sedative to relax you.  You will lie on an exam table with your feet and legs supported as in a pelvic exam.  Your health care provider will insert an instrument (speculum) into your vagina to see your cervix.  Your cervix will be cleansed with an antiseptic solution. A medicine (local anesthetic) will be used to numb the cervix.  A forceps  instrument (tenaculum) will be used to hold your cervix steady for the biopsy.  A thin, rodlike instrument (uterine sound) will be inserted through your cervix to determine the length of your uterus and the location where the biopsy sample will be removed.  A thin, flexible tube (catheter) will be inserted through your cervix and into the uterus. The catheter is used to collect the biopsy sample from your endometrial tissue.  The catheter and speculum will then be removed, and the tissue sample will be sent to a lab for examination. AFTER THE PROCEDURE  You will rest in a recovery area until you are ready to go home.  You may have mild cramping and a small amount of vaginal bleeding for a few days after the procedure. This is normal.  Make sure you find out how to get your test results. Document Released: 08/06/2004 Document Revised: 12/06/2012 Document Reviewed: 09/20/2012 [facility] Patient Information 2015 [place], [place]. This information is not intended to replace advice given to you by your health care provider. Make sure you discuss any questions you have with your health care provider.  Abnormal Uterine Bleeding Abnormal uterine bleeding can affect women at various stages in life, including teenagers, women in their reproductive years, pregnant women, and women who have reached menopause. Several kinds of uterine bleeding are considered abnormal, including:  Bleeding or spotting between periods.   Bleeding after sexual intercourse.   Bleeding that is heavier or more than normal.   Periods that last longer than usual.  Bleeding after menopause.  Many cases of abnormal  uterine bleeding are minor and simple to treat, while others are more serious. Any type of abnormal bleeding should be evaluated by your health care provider. Treatment will depend on the cause of the bleeding. HOME CARE INSTRUCTIONS Monitor your condition for any changes. The following actions may help to  alleviate any discomfort you are experiencing:  Avoid the use of tampons and douches as directed by your health care provider.  Change your pads frequently. You should get regular pelvic exams and Pap tests. Keep all follow-up appointments for diagnostic tests as directed by your health care provider.  SEEK MEDICAL CARE IF:   Your bleeding lasts more than 1 week.   You feel dizzy at times.  SEEK IMMEDIATE MEDICAL CARE IF:   You pass out.   You are changing pads every 15 to 30 minutes.   You have abdominal pain.  You have a fever.   You become sweaty or weak.   You are passing large blood clots from the vagina.   You start to feel nauseous and vomit. MAKE SURE YOU:   Understand these instructions.  Will watch your condition.  Will get help right away if you are not doing well or get worse. Document Released: 04/05/2005 Document Revised: 04/10/2013 Document Reviewed: 11/02/2012 [facility] Patient Information 2015 [place], [place]. This information is not intended to replace advice given to you by your health care provider. Make sure you discuss any questions you have with your health care provider. Medroxyprogesterone injection [Contraceptive] What is this medicine? MEDROXYPROGESTERONE (me DROX ee proe JES te rone) contraceptive injections prevent pregnancy. They provide effective birth control for 3 months. Depo-subQ Provera 104 is also used for treating pain related to endometriosis. This medicine may be used for other purposes; ask your health care provider or pharmacist if you have questions. COMMON BRAND NAME(S): Depo-Provera, Depo-subQ Provera 104 What should I tell my health care provider before I take this medicine? They need to know if you have any of these conditions: -frequently drink alcohol -asthma -blood vessel disease or a history of a blood clot in the lungs or legs -bone disease such as osteoporosis -breast cancer -diabetes -eating disorder (anorexia  nervosa or bulimia) -high blood pressure -HIV infection or AIDS -kidney disease -liver disease -mental depression -migraine -seizures (convulsions) -stroke -tobacco smoker -vaginal bleeding -an unusual or allergic reaction to medroxyprogesterone, other hormones, medicines, foods, dyes, or preservatives -pregnant or trying to get pregnant -breast-feeding How should I use this medicine? Depo-Provera Contraceptive injection is given into a muscle. Depo-subQ Provera 104 injection is given under the skin. These injections are given by a health care professional. You must not be pregnant before getting an injection. The injection is usually given during the first 5 days after the start of a menstrual period or 6 weeks after delivery of a baby. Talk to your pediatrician regarding the use of this medicine in children. Special care may be needed. These injections have been used in female children who have started having menstrual periods. Overdosage: If you think you have taken too much of this medicine contact a poison control center or emergency room at once. NOTE: This medicine is only for you. Do not share this medicine with others. What if I miss a dose? Try not to miss a dose. You must get an injection once every 3 months to maintain birth control. If you cannot keep an appointment, call and reschedule it. If you wait longer than 13 weeks between Depo-Provera contraceptive injections or longer than  14 weeks between Depo-subQ Provera 104 injections, you could get pregnant. Use another method for birth control if you miss your appointment. You may also need a pregnancy test before receiving another injection. What may interact with this medicine? Do not take this medicine with any of the following medications: -bosentan This medicine may also interact with the following medications: -aminoglutethimide -antibiotics or medicines for infections, especially rifampin, rifabutin, rifapentine, and  griseofulvin -aprepitant -barbiturate medicines such as phenobarbital or primidone -bexarotene -carbamazepine -medicines for seizures like ethotoin, felbamate, oxcarbazepine, phenytoin, topiramate -modafinil -St. John's wort This list may not describe all possible interactions. Give your health care provider a list of all the medicines, herbs, non-prescription drugs, or dietary supplements you use. Also tell them if you smoke, drink alcohol, or use illegal drugs. Some items may interact with your medicine. What should I watch for while using this medicine? This drug does not protect you against HIV infection (AIDS) or other sexually transmitted diseases. Use of this product may cause you to lose calcium from your bones. Loss of calcium may cause weak bones (osteoporosis). Only use this product for more than 2 years if other forms of birth control are not right for you. The longer you use this product for birth control the more likely you will be at risk for weak bones. Ask your health care professional how you can keep strong bones. You may have a change in bleeding pattern or irregular periods. Many females stop having periods while taking this drug. If you have received your injections on time, your chance of being pregnant is very low. If you think you may be pregnant, see your health care professional as soon as possible. Tell your health care professional if you want to get pregnant within the next year. The effect of this medicine may last a long time after you get your last injection. What side effects may I notice from receiving this medicine? Side effects that you should report to your doctor or health care professional as soon as possible: -allergic reactions like skin rash, itching or hives, swelling of the face, lips, or tongue -breast tenderness or discharge -breathing problems -changes in vision -depression -feeling faint or lightheaded, falls -fever -pain in the abdomen, chest,  groin, or leg -problems with balance, talking, walking -unusually weak or tired -yellowing of the eyes or skin Side effects that usually do not require medical attention (report to your doctor or health care professional if they continue or are bothersome): -acne -fluid retention and swelling -headache -irregular periods, spotting, or absent periods -temporary pain, itching, or skin reaction at site where injected -weight gain This list may not describe all possible side effects. Call your doctor for medical advice about side effects. You may report side effects to FDA at 1-800-FDA-1088. Where should I keep my medicine? This does not apply. The injection will be given to you by a health care professional. NOTE: This sheet is a summary. It may not cover all possible information. If you have questions about this medicine, talk to your doctor, pharmacist, or health care provider.  2015, [employer]vier/Gold Standard. (2008-04-26 18:37:56) Intrauterine Device Information An intrauterine device (IUD) is inserted into your uterus to prevent pregnancy. There are two types of IUDs available:   Copper IUD--This type of IUD is wrapped in copper wire and is placed inside the uterus. Copper makes the uterus and fallopian tubes produce a fluid that kills sperm. The copper IUD can stay in place for 10 years.  Hormone IUD--This  type of IUD contains the hormone progestin (synthetic progesterone). The hormone thickens the cervical mucus and prevents sperm from entering the uterus. It also thins the uterine lining to prevent implantation of a fertilized egg. The hormone can weaken or kill the sperm that get into the uterus. One type of hormone IUD can stay in place for 5 years, and another type can stay in place for 3 years. Your health care provider will make sure you are a good candidate for a contraceptive IUD. Discuss with your health care provider the possible side effects.  ADVANTAGES OF AN INTRAUTERINE  DEVICE  IUDs are highly effective, reversible, long acting, and low maintenance.   There are no estrogen-related side effects.   An IUD can be used when breastfeeding.   IUDs are not associated with weight gain.   The copper IUD works immediately after insertion.   The hormone IUD works right away if inserted within 7 days of your period starting. You will need to use a backup method of birth control for 7 days if the hormone IUD is inserted at any other time in your cycle.  The copper IUD does not interfere with your female hormones.   The hormone IUD can make heavy menstrual periods lighter and decrease cramping.   The hormone IUD can be used for 3 or 5 years.   The copper IUD can be used for 10 years. DISADVANTAGES OF AN INTRAUTERINE DEVICE  The hormone IUD can be associated with irregular bleeding patterns.   The copper IUD can make your menstrual flow heavier and more painful.   You may experience cramping and vaginal bleeding after insertion.  Document Released: 03/09/2004 Document Revised: 12/06/2012 Document Reviewed: 09/24/2012 [facility] Patient Information 2015 [place], [place]. This information is not intended to replace advice given to you by your health care provider. Make sure you discuss any questions you have with your health care provider.

## 2014-08-17 NOTE — MAU Provider Note (Signed)
History     CSN: [id number]  Arrival date and time: 08/17/14 2032   First Provider Initiated Contact with Patient 08/17/14 2111      Chief Complaint  Patient presents with  . Vaginal Bleeding   HPI  Ms. Margaret Bass is a 54 y.o. G3P0 at Unknown who presents to MAU today with complaint of vaginal bleeding since 08/08/14 that became heavier yesterday. She states a long history of heavy periods, but worse over the last few months. She states she has been on birth control x 10-15 years for bleeding control and on Lo Loestrin OCPs x 2 years. She denies dizziness, fatigue or SOB and endorses some mild weakness. She denies any other GYN history.   OB History    Gravida Para Term Preterm AB TAB SAB Ectopic Multiple Living   3         3      Past Medical History  Diagnosis Date  . Seasonal allergies   . Insomnia   . Irregular heart rate 07/29/2011    ER stated that it was a virus    Past Surgical History  Procedure Laterality Date  . Cholecystectomy    . Tonsillectomy    . Wisdom tooth extraction      Family History  Problem Relation Age of Onset  . Hypertension Mother   . Diabetes Father   . Breast cancer Maternal Grandmother     History  Substance Use Topics  . Smoking status: Former Smoker    Types: Cigarettes    Quit date: 08/20/1991  . Smokeless tobacco: Never Used  . Alcohol Use: 1.2 oz/week    2 Cans of beer per week    Allergies:  Allergies  Allergen Reactions  . Penicillins Hives    Prescriptions prior to admission  Medication Sig Dispense Refill Last Dose  . cetirizine (ZYRTEC) 10 MG tablet Take 10 mg by mouth daily.   08/17/2014 at Unknown time  . fluticasone (FLONASE) 50 MCG/ACT nasal spray as needed.    Past Month at Unknown time  . LO LOESTRIN FE 1 MG-10 MCG / 10 MCG tablet TAKE 1 TABLET BY MOUTH ONCE A DAY 28 tablet 11 08/17/2014 at Unknown time  . Multiple Vitamin (MULTIVITAMIN) tablet Take 1 tablet by mouth as needed.   Past Week at Unknown time   . OVER THE COUNTER MEDICATION Advil PM   08/16/2014 at Unknown time  . bisacodyl (DULCOLAX) 5 MG EC tablet Take 5 mg by mouth. Dulcolax bowel prep # 4-Take as directed   04/25/2014  . naproxen sodium (ANAPROX) 220 MG tablet Take 440 mg by mouth once as needed. For pain   Not Taking    Review of Systems  Constitutional: Negative for fever and malaise/fatigue.  Gastrointestinal: Positive for abdominal pain. Negative for nausea, vomiting, diarrhea and constipation.  Genitourinary:       + vaginal bleeding  Neurological: Positive for weakness. Negative for dizziness and loss of consciousness.   Physical Exam   Blood pressure 142/74, pulse 82, temperature 98.3 F (36.8 C), temperature source Oral, resp. rate 16, height 5\' 6"  (1.676 m), weight 161 lb 6 oz (73.199 kg), last menstrual period 08/08/2014.  Physical Exam  Nursing note and vitals reviewed. Constitutional: She is oriented to person, place, and time. She appears well-developed and well-nourished. No distress.  HENT:  Head: Normocephalic and atraumatic.  Cardiovascular: Normal rate.   Respiratory: Effort normal.  GI: Soft. She exhibits no distension and no mass. There  is no tenderness. There is no rebound and no guarding.  Genitourinary: Uterus is not enlarged and not tender. Cervix exhibits no motion tenderness, no discharge and no friability. Right adnexum displays no mass and no tenderness. Left adnexum displays no mass and no tenderness. There is bleeding (moderate amount of bleeding with one small clot noted) in the vagina. No vaginal discharge found.  Neurological: She is alert and oriented to person, place, and time.  Skin: Skin is warm and dry. No erythema.  Psychiatric: She has a normal mood and affect.   Results for orders placed or performed during the hospital encounter of 08/17/14 (from the past 24 hour(s))  Pregnancy, urine POC     Status: None   Collection Time: 08/17/14  8:49 PM  Result Value Ref Range   Preg Test,  Ur NEGATIVE NEGATIVE  CBC with Differential/Platelet     Status: None   Collection Time: 08/17/14  9:05 PM  Result Value Ref Range   WBC 10.3 4.0 - 10.5 K/uL   RBC 4.22 3.87 - 5.11 MIL/uL   Hemoglobin 13.4 12.0 - 15.0 g/dL   HCT 38.8 36.0 - 46.0 %   MCV 91.9 78.0 - 100.0 fL   MCH 31.8 26.0 - 34.0 pg   MCHC 34.5 30.0 - 36.0 g/dL   RDW 13.0 11.5 - 15.5 %   Platelets 246 150 - 400 K/uL   Neutrophils Relative % 69 43 - 77 %   Neutro Abs 7.1 1.7 - 7.7 K/uL   Lymphocytes Relative 22 12 - 46 %   Lymphs Abs 2.3 0.7 - 4.0 K/uL   Monocytes Relative 8 3 - 12 %   Monocytes Absolute 0.9 0.1 - 1.0 K/uL   Eosinophils Relative 1 0 - 5 %   Eosinophils Absolute 0.1 0.0 - 0.7 K/uL   Basophils Relative 0 0 - 1 %   Basophils Absolute 0.0 0.0 - 0.1 K/uL    MAU Course  Procedures None  MDM UPT - negative CBC today Patient is hemodynamically stable, will complete work-up as outpatient with [facility] and outpatient [place] next week.  Assessment and Plan  A: Abnormal uterine bleeding, most likely peri-menopausal  P: Discharge home Rx for Megace and Iron supplements given to patient Patient advised to try Ibuprofen PRN for pain Bleeding precautions discussed Outpatient [place] ordered. They will call the patient to schedule.  Patient advised to follow-up with [facility] for further work-up. They will call her with an appointment Patient may return to MAU as needed or if her condition were to change or worsen  Luvenia Redden, PA-C  08/17/2014, 9:11 PM

## 2014-08-19 ENCOUNTER — Telehealth: Payer: Self-pay | Admitting: Internal Medicine

## 2014-08-19 NOTE — Telephone Encounter (Signed)
Called complaining of heavy menstrual bleeding for 10 days. Says lots of clots are coming out and she's quite concerned protect we have the past 2 days. Ask her to be seen at [facility] on an urgent basis. Will need follow-up with GYN. Has been on Loestrin .

## 2014-08-22 ENCOUNTER — Ambulatory Visit (HOSPITAL_COMMUNITY)
Admission: RE | Admit: 2014-08-22 | Discharge: 2014-08-22 | Disposition: A | Discharge status: Home / Self Care | Payer: 59 | Source: Ambulatory Visit | Attending: Medical | Admitting: Medical

## 2014-08-22 ENCOUNTER — Other Ambulatory Visit (HOSPITAL_COMMUNITY): Payer: Self-pay | Admitting: Medical

## 2014-08-22 DIAGNOSIS — N939 Abnormal uterine and vaginal bleeding, unspecified: Secondary | ICD-10-CM

## 2014-08-22 DIAGNOSIS — D252 Subserosal leiomyoma of uterus: Secondary | ICD-10-CM | POA: Diagnosis not present

## 2014-09-06 ENCOUNTER — Encounter: Payer: Self-pay | Admitting: Internal Medicine

## 2014-09-13 ENCOUNTER — Ambulatory Visit (INDEPENDENT_AMBULATORY_CARE_PROVIDER_SITE_OTHER): Payer: 59 | Admitting: Obstetrics and Gynecology

## 2014-09-13 ENCOUNTER — Encounter: Payer: Self-pay | Admitting: Obstetrics and Gynecology

## 2014-09-13 VITALS — BP 138/84 | HR 104 | Temp 98.8°F | Wt 160.1 lb

## 2014-09-13 DIAGNOSIS — Z3202 Encounter for pregnancy test, result negative: Secondary | ICD-10-CM | POA: Diagnosis not present

## 2014-09-13 DIAGNOSIS — Z01812 Encounter for preprocedural laboratory examination: Secondary | ICD-10-CM

## 2014-09-13 DIAGNOSIS — N938 Other specified abnormal uterine and vaginal bleeding: Secondary | ICD-10-CM

## 2014-09-13 LAB — POCT PREGNANCY, URINE: Preg Test, Ur: NEGATIVE

## 2014-09-13 MED ORDER — MEGESTROL ACETATE 40 MG PO TABS: ORAL_TABLET | ORAL | Status: DC

## 2014-09-13 NOTE — Addendum Note (Signed)
Addended by: Shelly Coss on: 09/13/2014 11:23 AM   Modules accepted: Orders

## 2014-09-13 NOTE — Progress Notes (Signed)
Patient ID: Margaret Bass, female   DOB: 1960-08-06, 54 y.o.   MRN: [id number] 54 yo presenting today as an MAU follow up for the evaluation of abnormal uterine bleeding. Patient reports a long standing history of heavy vaginal bleeding medically managed with OCP. She reports regular monthly cycles lasting 5-12 days some are light and others are heavy with passage of large clots associated with some cramping pains. Patient reports that even while on OCPs her cycle would not necessarily occur during the placebo pills and thus they were discontinued in April and she was started on megace. She reports good response to the Megace without any further episodes of vaginal bleeding. Patient also reports other menopausal symptoms of night sweats and hot flushes.  Past Medical History  Diagnosis Date  . Seasonal allergies   . Insomnia   . Irregular heart rate 07/29/2011    ER stated that it was a virus   Past Surgical History  Procedure Laterality Date  . Cholecystectomy    . Tonsillectomy    . Wisdom tooth extraction     Family History  Problem Relation Age of Onset  . Hypertension Mother   . Diabetes Father   . Breast cancer Maternal Grandmother    History  Substance Use Topics  . Smoking status: Former Smoker    Types: Cigarettes    Quit date: 08/20/1991  . Smokeless tobacco: Never Used  . Alcohol Use: 1.2 oz/week    2 Cans of beer per week   ROS See pertinent in HPI  GENERAL: Well-developed, well-nourished female in no acute distress.  ABDOMEN: Soft, nontender, nondistended. No organomegaly. PELVIC: Normal external female genitalia. Vagina is pink and rugated.  Normal discharge. Normal appearing cervix. Uterus is normal in size. No adnexal mass or tenderness. EXTREMITIES: No cyanosis, clubbing, or edema, 2+ distal pulses.  08/22/2014 Ultrasound FINDINGS: Uterus  Measurements: 7.6 x 5.3 x 5.1 cm. 2.7 x 2.2 x 2.4 cm subserosal fibroid in the anterior  fundus.  Endometrium  Thickness: 13 mm. 1.7 x 0.9 x 0.9 cm probable submucosal/intracavitary fibroid with calcifications and color Doppler flow, less likely an endometrial polyp.  Right ovary  Measurements: 1.6 x 0.8 x 2.3 cm. Normal appearance/no adnexal mass.  Left ovary  Measurements: 1.8 x 1.7 x 1.5 cm. Normal appearance/no adnexal mass.  Other findings  No free fluid.  IMPRESSION: 2.7 cm subserosal fibroid in the anterior uterine fundus.  Additional 1.7 cm probable submucosal/ intracavitary fibroid, less likely an endometrial polyp. Consider hysteroscopy and/or endometrial sampling.  Endometrial complex measures 13 mm.   A/P 54 yo with dysfunctional uterine bleeding - Ultrasound results reviewed and explained to the patient - Discussed the need for endometrial biopsy to rule out malignancy - Abnormal bleeding may be secondary to perimenopausal state - patient declined endometrial biopsy today and would rather wait a few months and follow up at [facility] office which is closer to her residence - Refill on megace provided

## 2015-06-03 ENCOUNTER — Other Ambulatory Visit: Payer: Self-pay | Admitting: Internal Medicine

## 2015-06-03 LAB — COMPLETE METABOLIC PANEL WITH GFR
ALT: 25 U/L (ref 6–29)
AST: 21 U/L (ref 10–35)
Albumin: 4.1 g/dL (ref 3.6–5.1)
Alkaline Phosphatase: 74 U/L (ref 33–130)
BILIRUBIN TOTAL: 0.5 mg/dL (ref 0.2–1.2)
BUN: 9 mg/dL (ref 7–25)
CHLORIDE: 104 mmol/L (ref 98–110)
CO2: 26 mmol/L (ref 20–31)
CREATININE: 0.67 mg/dL (ref 0.50–1.05)
Calcium: 9.9 mg/dL (ref 8.6–10.4)
GFR, Est African American: >89 mL/min (ref >=60)
Glucose, Bld: 83 mg/dL (ref 65–99)
Potassium: 4 mmol/L (ref 3.5–5.3)
SODIUM: 139 mmol/L (ref 135–146)
TOTAL PROTEIN: 7 g/dL (ref 6.1–8.1)

## 2015-06-03 LAB — CBC WITH DIFFERENTIAL/PLATELET
Basophils Absolute: 0.1 10*3/uL (ref 0.0–0.1)
Basophils Relative: 1 % (ref 0–1)
EOS PCT: 1 % (ref 0–5)
Eosinophils Absolute: 0.1 10*3/uL (ref 0.0–0.7)
HCT: 43.9 % (ref 36.0–46.0)
Hemoglobin: 14.9 g/dL (ref 12.0–15.0)
LYMPHS PCT: 22 % (ref 12–46)
Lymphs Abs: 1.4 10*3/uL (ref 0.7–4.0)
MCH: 30.6 pg (ref 26.0–34.0)
MCHC: 33.9 g/dL (ref 30.0–36.0)
MCV: 90.1 fL (ref 78.0–100.0)
MONO ABS: 0.5 10*3/uL (ref 0.1–1.0)
MPV: 13 fL — AB (ref 8.6–12.4)
Monocytes Relative: 8 % (ref 3–12)
Neutro Abs: 4.4 10*3/uL (ref 1.7–7.7)
Neutrophils Relative %: 68 % (ref 43–77)
Platelets: 287 10*3/uL (ref 150–400)
RBC: 4.87 MIL/uL (ref 3.87–5.11)
RDW: 13.3 % (ref 11.5–15.5)
WBC: 6.5 10*3/uL (ref 4.0–10.5)

## 2015-06-03 LAB — LIPID PANEL
Cholesterol: 210 mg/dL — ABNORMAL HIGH (ref 125–200)
HDL: 49 mg/dL (ref >=46)
LDL CALC: 124 mg/dL (ref <130)
Total CHOL/HDL Ratio: 4.3 Ratio (ref <=5.0)
Triglycerides: 184 mg/dL — ABNORMAL HIGH (ref <150)
VLDL: 37 mg/dL — ABNORMAL HIGH (ref <30)

## 2015-06-03 LAB — TSH: TSH: 1.27 mIU/L

## 2015-06-04 LAB — VITAMIN D 25 HYDROXY (VIT D DEFICIENCY, FRACTURES): Vit D, 25-Hydroxy: 32 ng/mL (ref 30–100)

## 2015-06-05 ENCOUNTER — Encounter: Payer: Self-pay | Admitting: Internal Medicine

## 2015-06-05 ENCOUNTER — Other Ambulatory Visit (HOSPITAL_COMMUNITY)
Admission: RE | Admit: 2015-06-05 | Discharge: 2015-06-05 | Disposition: A | Discharge status: Home / Self Care | Payer: 59 | Source: Ambulatory Visit | Attending: Internal Medicine | Admitting: Internal Medicine

## 2015-06-05 ENCOUNTER — Ambulatory Visit (INDEPENDENT_AMBULATORY_CARE_PROVIDER_SITE_OTHER): Payer: 59 | Admitting: Internal Medicine

## 2015-06-05 VITALS — BP 126/78 | HR 82 | Temp 97.6°F | Resp 18 | Ht 66.0 in | Wt 162.0 lb

## 2015-06-05 DIAGNOSIS — E781 Pure hyperglyceridemia: Secondary | ICD-10-CM

## 2015-06-05 DIAGNOSIS — D171 Benign lipomatous neoplasm of skin and subcutaneous tissue of trunk: Secondary | ICD-10-CM

## 2015-06-05 DIAGNOSIS — E669 Obesity, unspecified: Secondary | ICD-10-CM

## 2015-06-05 DIAGNOSIS — Z124 Encounter for screening for malignant neoplasm of cervix: Secondary | ICD-10-CM | POA: Diagnosis not present

## 2015-06-05 DIAGNOSIS — Z0000 Encounter for general adult medical examination without abnormal findings: Secondary | ICD-10-CM

## 2015-06-05 DIAGNOSIS — D252 Subserosal leiomyoma of uterus: Secondary | ICD-10-CM

## 2015-06-05 DIAGNOSIS — Z01419 Encounter for gynecological examination (general) (routine) without abnormal findings: Secondary | ICD-10-CM | POA: Diagnosis not present

## 2015-06-05 DIAGNOSIS — J309 Allergic rhinitis, unspecified: Secondary | ICD-10-CM | POA: Diagnosis not present

## 2015-06-10 ENCOUNTER — Other Ambulatory Visit: Payer: Self-pay | Admitting: Internal Medicine

## 2015-06-10 LAB — CYTOLOGY - PAP

## 2015-07-12 ENCOUNTER — Encounter: Payer: Self-pay | Admitting: Internal Medicine

## 2015-07-12 NOTE — Patient Instructions (Signed)
Recommend annual mammogram. Please work on diet exercise and weight loss. Triglycerides are elevated. Return in one year or as needed.

## 2015-07-12 NOTE — Progress Notes (Signed)
   Subjective:    Patient ID: Margaret Bass, female    DOB: 1961/03/21, 55 y.o.   MRN: [id number]  HPI 55 year old White Female in today for health maintenance exam and evaluation of medical issues. She is feeling well and has no complaints. She has previously been treated with oral contraceptives to manage heavy menses. Subsequently saw GYN physician was changed to Megace but this is now discontinued. Was found to have a subserosal fibroid by GYN physician in May 2016. History of elevated blood pressure but this has been managed conservatively-seems to be intermittent. Blood pressure today is normal.  Past medical history: History of allergic rhinitis. History of fractured clavicle 1979. Cholecystectomy 1981. 3 pregnancies and no miscarriages.  Social history: Married. Husband is employed by [employer] as an Chief Financial Officer. 3 adult children. 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 She and her husband moved here from [place] in 2013.  Family history: Father with history of diabetes and hypertension mother with history of hypertension. One brother with hypertension. One sister in good health.  She is allergic penicillin-causes hives.  Had tetanus immunization in 2009.  History of lipoma right trunk which is not changed in size and is not painful.  Review of lab work shows fasting triglycerides of 184 and previously were normal last year    Review of Systems noncontributory     Objective:   Physical Exam  Constitutional: She is oriented to person, place, and time. She appears well-developed and well-nourished. No distress.  HENT:  Head: Normocephalic and atraumatic.  Right Ear: External ear normal.  Left Ear: External ear normal.  Mouth/Throat: Oropharynx is clear and moist. No oropharyngeal exudate.  Eyes: Conjunctivae and EOM are normal. Pupils are equal, round, and reactive to light. Right eye exhibits no discharge. Left eye exhibits no  discharge. No scleral icterus.  Neck: Neck supple. No JVD present. No thyromegaly present.  Cardiovascular: Normal rate, regular rhythm, normal heart sounds and intact distal pulses.   No murmur heard. Pulmonary/Chest: Effort normal and breath sounds normal. She has no wheezes. She has no rales.  Breasts normal female without masses  Abdominal: Soft. Bowel sounds are normal. She exhibits no distension and no mass. There is no tenderness. There is no rebound and no guarding.  Genitourinary:  Pap taken. Bimanual normal.  Musculoskeletal: She exhibits no edema.  Lymphadenopathy:    She has no cervical adenopathy.  Neurological: She is alert and oriented to person, place, and time. She has normal reflexes. She displays normal reflexes. No cranial nerve deficit. Coordination normal.  Skin: Skin is warm and dry. No rash noted. She is not diaphoretic.  Psychiatric: She has a normal mood and affect. Her behavior is normal. Judgment and thought content normal.  Vitals reviewed.         Assessment & Plan:  Normal health maintenance exam  History of subserosal fibroid followed by GYN  Allergic rhinitis  Hypertriglyceridemia-recommend diet and exercise and recheck in one year  Plan: Return in one year or as needed. Work on diet and exercise

## 2015-10-17 ENCOUNTER — Other Ambulatory Visit: Payer: Self-pay | Admitting: Internal Medicine

## 2015-11-13 ENCOUNTER — Ambulatory Visit (INDEPENDENT_AMBULATORY_CARE_PROVIDER_SITE_OTHER): Payer: 59 | Admitting: Obstetrics & Gynecology

## 2015-11-13 ENCOUNTER — Encounter: Payer: Self-pay | Admitting: Obstetrics & Gynecology

## 2015-11-13 VITALS — BP 146/87 | HR 75 | Ht 66.0 in | Wt 160.0 lb

## 2015-11-13 DIAGNOSIS — N921 Excessive and frequent menstruation with irregular cycle: Secondary | ICD-10-CM

## 2015-11-13 DIAGNOSIS — D250 Submucous leiomyoma of uterus: Secondary | ICD-10-CM

## 2015-11-13 NOTE — Progress Notes (Signed)
   Subjective:    Patient ID: Margaret Bass, female    DOB: 07-02-1960, 55 y.o.   MRN: [id number]  HPI 55 yo MW P3 (75, 47, and 11 yo kids) here today to discuss her long long h/o irregular bleeding. She was seen last year by Dr. Elly Modena and and u/s at that time showed a subserosal and submucosal fibroid. She tried some OTC progesterone cream. She has been taking megace since 6/17 (that was meds left over from Dr. Domenic Schwab prescription from 2016.  Her periods will not stop unless she takes megace.   Review of Systems Last pap 12/16 normal Mammogram 2017 and normal Husband had vasectomy She denies GSUI    Objective:   Physical Exam  WNWHWFNAD Breathing, conversing, and ambulating normally      Assessment & Plan:  Menorrhagia- but normal hbg Fibroids on u/s Offered EMBX versus d&c with endometrial ablation (HTA)

## 2015-11-19 ENCOUNTER — Encounter (HOSPITAL_COMMUNITY): Payer: Self-pay | Admitting: *Deleted

## 2015-11-21 ENCOUNTER — Encounter (HOSPITAL_COMMUNITY): Payer: Self-pay | Admitting: *Deleted

## 2015-12-05 ENCOUNTER — Encounter (HOSPITAL_COMMUNITY): Payer: Self-pay | Admitting: *Deleted

## 2015-12-29 ENCOUNTER — Encounter (HOSPITAL_COMMUNITY): Payer: Self-pay

## 2015-12-29 ENCOUNTER — Ambulatory Visit (HOSPITAL_COMMUNITY): Payer: 59 | Admitting: Anesthesiology

## 2015-12-29 ENCOUNTER — Encounter (HOSPITAL_COMMUNITY)
Admission: RE | Disposition: A | Discharge status: Home / Self Care | Payer: Self-pay | Source: Ambulatory Visit | Attending: Obstetrics & Gynecology

## 2015-12-29 ENCOUNTER — Ambulatory Visit (HOSPITAL_COMMUNITY)
Admission: RE | Admit: 2015-12-29 | Discharge: 2015-12-29 | Disposition: A | Discharge status: Home / Self Care | Payer: 59 | Source: Ambulatory Visit | Attending: Obstetrics & Gynecology | Admitting: Obstetrics & Gynecology

## 2015-12-29 DIAGNOSIS — Z538 Procedure and treatment not carried out for other reasons: Secondary | ICD-10-CM | POA: Insufficient documentation

## 2015-12-29 LAB — CBC
HEMATOCRIT: 43 % (ref 36.0–46.0)
HEMOGLOBIN: 15 g/dL (ref 12.0–15.0)
MCH: 31.9 pg (ref 26.0–34.0)
MCHC: 34.9 g/dL (ref 30.0–36.0)
MCV: 91.5 fL (ref 78.0–100.0)
PLATELETS: 239 10*3/uL (ref 150–400)
RBC: 4.7 MIL/uL (ref 3.87–5.11)
RDW: 12.9 % (ref 11.5–15.5)
WBC: 7.4 10*3/uL (ref 4.0–10.5)

## 2015-12-29 SURGERY — DILATATION & CURETTAGE/HYSTEROSCOPY WITH HYDROTHERMAL ABLATION
Anesthesia: General | Site: Vagina

## 2015-12-29 MED ORDER — MIDAZOLAM HCL 2 MG/2ML IJ SOLN
INTRAMUSCULAR | Status: AC
  Filled 2015-12-29: qty 2

## 2015-12-29 MED ORDER — LIDOCAINE HCL (CARDIAC) 20 MG/ML IV SOLN
INTRAVENOUS | Status: AC
Start: 2015-12-29 — End: 2015-12-29
  Filled 2015-12-29: qty 5

## 2015-12-29 MED ORDER — PROPOFOL 10 MG/ML IV BOLUS
INTRAVENOUS | Status: AC
Start: 2015-12-29 — End: 2015-12-29
  Filled 2015-12-29: qty 20

## 2015-12-29 MED ORDER — ONDANSETRON HCL 4 MG/2ML IJ SOLN
INTRAMUSCULAR | Status: AC
  Filled 2015-12-29: qty 2

## 2015-12-29 MED ORDER — BUPIVACAINE HCL (PF) 0.5 % IJ SOLN
INTRAMUSCULAR | Status: AC
  Filled 2015-12-29: qty 30

## 2015-12-29 MED ORDER — LACTATED RINGERS IV SOLN
INTRAVENOUS | Status: DC
  Administered 2015-12-29: 11:00:00 via INTRAVENOUS

## 2015-12-29 MED ORDER — DEXAMETHASONE SODIUM PHOSPHATE 10 MG/ML IJ SOLN
INTRAMUSCULAR | Status: AC
  Filled 2015-12-29: qty 1

## 2015-12-29 MED ORDER — SCOPOLAMINE 1 MG/3DAYS TD PT72
1.0000 | MEDICATED_PATCH | Freq: Once | TRANSDERMAL | Status: DC
  Administered 2015-12-29: 1.5 mg via TRANSDERMAL

## 2015-12-29 MED ORDER — FENTANYL CITRATE (PF) 100 MCG/2ML IJ SOLN
INTRAMUSCULAR | Status: AC
  Filled 2015-12-29: qty 2

## 2015-12-29 NOTE — Progress Notes (Signed)
Margaret Brevig presented to [facility] for a sugerical procedure with Dr. Clovia Cuff. Unfortunately due to a scheduling mishap, Dr. Hulan Fray is unavailable to perform the sugerical procedure.  I offered my services to Margaret. [name], however she desires a female physician. Also I do not perform the recommended suferical procedure. Thus her case had to be canceled Margaret Caulder and her family were very understanding. She will be contacted to reschedule her procedure.

## 2016-01-08 ENCOUNTER — Ambulatory Visit (HOSPITAL_COMMUNITY)
Admission: RE | Admit: 2016-01-08 | Discharge: 2016-01-08 | Disposition: A | Discharge status: Home / Self Care | Payer: 59 | Source: Ambulatory Visit | Attending: Obstetrics & Gynecology | Admitting: Obstetrics & Gynecology

## 2016-01-08 ENCOUNTER — Ambulatory Visit (HOSPITAL_COMMUNITY): Payer: 59 | Admitting: Anesthesiology

## 2016-01-08 ENCOUNTER — Encounter (HOSPITAL_COMMUNITY): Payer: Self-pay | Admitting: *Deleted

## 2016-01-08 ENCOUNTER — Encounter (HOSPITAL_COMMUNITY)
Admission: RE | Disposition: A | Discharge status: Home / Self Care | Payer: Self-pay | Source: Ambulatory Visit | Attending: Obstetrics & Gynecology

## 2016-01-08 DIAGNOSIS — N920 Excessive and frequent menstruation with regular cycle: Secondary | ICD-10-CM | POA: Diagnosis not present

## 2016-01-08 DIAGNOSIS — N854 Malposition of uterus: Secondary | ICD-10-CM | POA: Diagnosis not present

## 2016-01-08 DIAGNOSIS — Z87891 Personal history of nicotine dependence: Secondary | ICD-10-CM | POA: Diagnosis not present

## 2016-01-08 DIAGNOSIS — N938 Other specified abnormal uterine and vaginal bleeding: Secondary | ICD-10-CM | POA: Diagnosis present

## 2016-01-08 DIAGNOSIS — D259 Leiomyoma of uterus, unspecified: Secondary | ICD-10-CM

## 2016-01-08 DIAGNOSIS — Z880 Allergy status to penicillin: Secondary | ICD-10-CM | POA: Insufficient documentation

## 2016-01-08 HISTORY — PX: DILITATION & CURRETTAGE/HYSTROSCOPY WITH HYDROTHERMAL ABLATION: SHX5570

## 2016-01-08 SURGERY — DILATATION & CURETTAGE/HYSTEROSCOPY WITH HYDROTHERMAL ABLATION
Anesthesia: General | Site: Vagina

## 2016-01-08 MED ORDER — MIDAZOLAM HCL 5 MG/5ML IJ SOLN
INTRAMUSCULAR | Status: DC | PRN
  Administered 2016-01-08: 2 mg via INTRAVENOUS

## 2016-01-08 MED ORDER — PROPOFOL 10 MG/ML IV BOLUS
INTRAVENOUS | Status: DC | PRN
Start: 2016-01-08 — End: 2016-01-08
  Administered 2016-01-08: 200 mg via INTRAVENOUS

## 2016-01-08 MED ORDER — SODIUM CHLORIDE 0.9 % IR SOLN
Status: DC | PRN
  Administered 2016-01-08: 3000 mL

## 2016-01-08 MED ORDER — OXYCODONE-ACETAMINOPHEN 5-325 MG PO TABS: 1.0000 | ORAL_TABLET | Freq: Four times a day (QID) | ORAL | 0 refills | Status: DC | PRN

## 2016-01-08 MED ORDER — SCOPOLAMINE 1 MG/3DAYS TD PT72
1.0000 | MEDICATED_PATCH | Freq: Once | TRANSDERMAL | Status: DC
  Administered 2016-01-08: 1.5 mg via TRANSDERMAL

## 2016-01-08 MED ORDER — LACTATED RINGERS IV SOLN
INTRAVENOUS | Status: DC
  Administered 2016-01-08 (×3): via INTRAVENOUS

## 2016-01-08 MED ORDER — DEXAMETHASONE SODIUM PHOSPHATE 4 MG/ML IJ SOLN
INTRAMUSCULAR | Status: DC | PRN
  Administered 2016-01-08: 4 mg via INTRAVENOUS

## 2016-01-08 MED ORDER — SCOPOLAMINE 1 MG/3DAYS TD PT72
MEDICATED_PATCH | TRANSDERMAL | Status: AC
  Filled 2016-01-08: qty 1

## 2016-01-08 MED ORDER — LIDOCAINE HCL (CARDIAC) 20 MG/ML IV SOLN
INTRAVENOUS | Status: AC
  Filled 2016-01-08: qty 5

## 2016-01-08 MED ORDER — PROPOFOL 10 MG/ML IV BOLUS
INTRAVENOUS | Status: AC
  Filled 2016-01-08: qty 20

## 2016-01-08 MED ORDER — IBUPROFEN 600 MG PO TABS: 600.0000 mg | ORAL_TABLET | Freq: Four times a day (QID) | ORAL | 1 refills | Status: DC | PRN

## 2016-01-08 MED ORDER — LIDOCAINE HCL (CARDIAC) 20 MG/ML IV SOLN
INTRAVENOUS | Status: DC | PRN
  Administered 2016-01-08: 80 mg via INTRAVENOUS

## 2016-01-08 MED ORDER — FENTANYL CITRATE (PF) 100 MCG/2ML IJ SOLN
25.0000 ug | INTRAMUSCULAR | Status: DC | PRN
  Administered 2016-01-08: 25 ug via INTRAVENOUS

## 2016-01-08 MED ORDER — BUPIVACAINE HCL (PF) 0.5 % IJ SOLN
INTRAMUSCULAR | Status: AC
  Filled 2016-01-08: qty 30

## 2016-01-08 MED ORDER — FENTANYL CITRATE (PF) 100 MCG/2ML IJ SOLN
INTRAMUSCULAR | Status: DC | PRN
  Administered 2016-01-08: 100 ug via INTRAVENOUS

## 2016-01-08 MED ORDER — KETOROLAC TROMETHAMINE 30 MG/ML IJ SOLN
INTRAMUSCULAR | Status: DC | PRN
  Administered 2016-01-08: 30 mg via INTRAVENOUS

## 2016-01-08 MED ORDER — BUPIVACAINE HCL (PF) 0.5 % IJ SOLN
INTRAMUSCULAR | Status: DC | PRN
  Administered 2016-01-08: 25 mL

## 2016-01-08 MED ORDER — ONDANSETRON HCL 4 MG/2ML IJ SOLN: 4.0000 mg | Freq: Once | INTRAMUSCULAR | Status: DC | PRN

## 2016-01-08 MED ORDER — FENTANYL CITRATE (PF) 100 MCG/2ML IJ SOLN
INTRAMUSCULAR | Status: AC
  Filled 2016-01-08: qty 2

## 2016-01-08 MED ORDER — ONDANSETRON HCL 4 MG/2ML IJ SOLN
INTRAMUSCULAR | Status: DC | PRN
  Administered 2016-01-08: 4 mg via INTRAVENOUS

## 2016-01-08 MED ORDER — MIDAZOLAM HCL 2 MG/2ML IJ SOLN
INTRAMUSCULAR | Status: AC
  Filled 2016-01-08: qty 2

## 2016-01-08 SURGICAL SUPPLY — 18 items
CANISTER SUCT 3000ML (MISCELLANEOUS) IMPLANT
CLOTH BEACON ORANGE TIMEOUT ST (SAFETY) ×3 IMPLANT
CONTAINER PREFILL 10% NBF 60ML (FORM) ×3 IMPLANT
DILATOR CANAL MILEX (MISCELLANEOUS) IMPLANT
ELECT REM PT RETURN 9FT ADLT (ELECTROSURGICAL)
ELECTRODE REM PT RTRN 9FT ADLT (ELECTROSURGICAL) IMPLANT
GLOVE BIO SURGEON STRL SZ 6.5 (GLOVE) ×2 IMPLANT
GLOVE BIO SURGEONS STRL SZ 6.5 (GLOVE) ×1
GLOVE BIOGEL PI IND STRL 7.0 (GLOVE) ×1 IMPLANT
GLOVE BIOGEL PI INDICATOR 7.0 (GLOVE) ×2
GOWN STRL REUS W/TWL LRG LVL3 (GOWN DISPOSABLE) ×6 IMPLANT
NEEDLE SPNL 18GX3.5 QUINCKE PK (NEEDLE) ×3 IMPLANT
PACK VAGINAL MINOR WOMEN LF (CUSTOM PROCEDURE TRAY) ×3 IMPLANT
PAD OB MATERNITY 4.3X12.25 (PERSONAL CARE ITEMS) ×3 IMPLANT
SET GENESYS HTA PROCERVA (MISCELLANEOUS) ×3 IMPLANT
SYR 30ML LL (SYRINGE) ×3 IMPLANT
TOWEL OR 17X24 6PK STRL BLUE (TOWEL DISPOSABLE) ×6 IMPLANT
WATER STERILE IRR 1000ML POUR (IV SOLUTION) ×3 IMPLANT

## 2016-01-08 NOTE — Op Note (Addendum)
01/08/2016  11:47 AM  PATIENT:  Margaret Bass  55 y.o. female  PRE-OPERATIVE DIAGNOSIS:  cpt [phone] - Menorrhagia and fibroids  POST-OPERATIVE DIAGNOSIS:   Menorrhagia and fibroids  PROCEDURE:  Procedure(s): DILATATION & CURETTAGE/HYSTEROSCOPY WITH HYDROTHERMAL ABLATION (N/A)  SURGEON:  Surgeon(s) and Role:    * Emily Filbert, MD - Primary   ANESTHESIA:   general  EBL:  Total I/O In: 1000 [I.V.:1000] Out: -   BLOOD ADMINISTERED:none  DRAINS: none   LOCAL MEDICATIONS USED:  MARCAINE     SPECIMEN:  Source of Specimen:  uterine curettings  DISPOSITION OF SPECIMEN:  PATHOLOGY  COUNTS:  YES  TOURNIQUET:  * No tourniquets in log *  DICTATION: .Dragon Dictation  PLAN OF CARE: Discharge to home after PACU  PATIENT DISPOSITION:  PACU - hemodynamically stable.   Delay start of Pharmacological VTE agent (>24hrs) due to surgical blood loss or risk of bleeding: not applicable    The risks, benefits, and alternatives of surgery were explained, understood, and accepted. I quoted her a 90% satisfaction rate for the endometrial ablation. All questions were answered. She was taken to the operating room and placed in the dorsal lithotomy position. General anesthesia was applied without complication. Her vagina was prepped and draped in the usual sterile fashion.  A bimanual exam revealed a normal size and shaped mobile uterus is retroverted.She has a moderate amount of uterine prolapse.  Her adnexa were non-enlarged. A speculum was placed  and the posterior lip of her cervix was grasped with a single-tooth tenaculum. A paracervical block was performed using 25 mL of 0.5% Marcaine. Her uterus sounded to 10 cm. The cervix was gently dilated with Hegar dilators to accommodate the HTA device. The arms of the device was deployed and the uterine cavity width measured 4.1 cm. Hysteroscopy was done. There was a small fibroid (about 5 mm) coming about half way out of the anterior uterine wall (towards  the left). The HTA device ran for the ususal 10 minutes and then did a cool down. I repeated the hysteroscopy and noted the entire endometrium and the small fibroid were quite blanched.  I removed the tenaculum and no bleeding was noted. The HTA  device was removed and no bleeding was noted from the endocervix. She was extubated and taken to the recovery room in stable condition. She tolerated the procedure well.

## 2016-01-08 NOTE — H&P (Signed)
Margaret Bass is an 55 y.o. female MW P3 here for a d&c and endometrial ablation. She has had DUB for about a year and a half. An u/s showed a possible intracavitary small fibroid. She has tried megace.  Patient's last menstrual period was 11/27/2015.    Past Medical History:  Diagnosis Date  . Insomnia   . Irregular heart rate 07/29/2011   ER stated that it was a virus  . Seasonal allergies   . SVD (spontaneous vaginal delivery)    x 3    Past Surgical History:  Procedure Laterality Date  . CHOLECYSTECTOMY    . colonoscopy    . TONSILLECTOMY    . WISDOM TOOTH EXTRACTION      Family History  Problem Relation Age of Onset  . Hypertension Mother   . Diabetes Father   . Dementia Father   . Breast cancer Maternal Grandmother     Social History:  reports that she quit smoking about 24 years ago. Her smoking use included Cigarettes. She has never used smokeless tobacco. She reports that she drinks about 1.2 oz of alcohol per week . She reports that she does not use drugs.  Allergies:  Allergies  Allergen Reactions  . Penicillins Hives    Has patient had a PCN reaction causing immediate rash, facial/tongue/throat swelling, SOB or lightheadedness with hypotension: Yes Has patient had a PCN reaction causing severe rash involving mucus membranes or skin necrosis: Yes Has patient had a PCN reaction that required hospitalization No Has patient had a PCN reaction occurring within the last 10 years: No If all of the above answers are "NO", then may proceed with Cephalosporin use.     Prescriptions Prior to Admission  Medication Sig Dispense Refill Last Dose  . diphenhydrAMINE (BENADRYL) 25 MG tablet Take 25 mg by mouth at bedtime as needed for sleep.    01/07/2016 at Unknown time  . Misc Natural Products (PROGESTERONE EX) Apply 1 application topically 2 (two) times daily. Progesterone cream   01/07/2016 at Unknown time  . Multiple Vitamin (MULTIVITAMIN) tablet Take 1 tablet by mouth  daily.    Past Week at Unknown time  . Olopatadine HCl (PAZEO) 0.7 % SOLN Place 1 drop into both eyes 2 (two) times daily.   01/07/2016 at Unknown time    ROS  Blood pressure 133/80, pulse 81, temperature 98.2 F (36.8 C), temperature source Oral, resp. rate 18, last menstrual period 11/27/2015, SpO2 100 %. Physical Exam  WNWHWFNAD Heart- RRR Lungs- CTAB Abd- benign  No results found for this or any previous visit (from the past 24 hour(s)).  No results found.  Assessment/Plan: DUB- plan for d&c, endometrial ablation.  She understands the risks of surgery, including, but not to infection, bleeding, DVTs, damage to bowel, bladder, ureters. She wishes to proceed. She understands the 90% satisfaction rate.     Emily Filbert 01/08/2016, 10:45 AM

## 2016-01-08 NOTE — Transfer of Care (Signed)
Immediate Anesthesia Transfer of Care Note  Patient: Margaret Bass. Truman Hayward  Procedure(s) Performed: Procedure(s): DILATATION & CURETTAGE/HYSTEROSCOPY WITH HYDROTHERMAL ABLATION (N/A)  Patient Location: PACU  Anesthesia Type:General  Level of Consciousness: awake, alert  and oriented  Airway & Oxygen Therapy: Patient Spontanous Breathing and Patient connected to nasal cannula oxygen  Post-op Assessment: Report given to RN and Post -op Vital signs reviewed and stable  Post vital signs: Reviewed and stable  Last Vitals:  Vitals:   01/08/16 0946 01/08/16 1153  BP: 133/80   Pulse: 81   Resp: 18 13  Temp: 36.8 C 36.7 C    Last Pain:  Vitals:   01/08/16 0946  TempSrc: Oral      Patients Stated Pain Goal: 3 ([id number])  Complications: No apparent anesthesia complications

## 2016-01-08 NOTE — Discharge Instructions (Signed)
Dilation and Curettage or Vacuum Curettage, Care After °Refer to this sheet in the next few weeks. These instructions provide you with information on caring for yourself after your procedure. Your health care provider may also give you more specific instructions. Your treatment has been planned according to current medical practices, but problems sometimes occur. Call your health care provider if you have any problems or questions after your procedure. °WHAT TO EXPECT AFTER THE PROCEDURE °After your procedure, it is typical to have light cramping and bleeding. This may last for 2 days to 2 weeks after the procedure. °HOME CARE INSTRUCTIONS  °· Do not drive for 24 hours. °· Wait 1 week before returning to strenuous activities. °· Take your temperature 2 times a day for 4 days and write it down. Provide these temperatures to your health care provider if you develop a fever. °· Avoid long periods of standing. °· Avoid heavy lifting, pushing, or pulling. Do not lift anything heavier than 10 pounds (4.5 kg). °· Limit stair climbing to once or twice a day. °· Take rest periods often. °· You may resume your usual diet. °· Drink enough fluids to keep your urine clear or pale yellow. °· Your usual bowel function should return. If you have constipation, you may: °¨ Take a mild laxative with permission from your health care provider. °¨ Add fruit and bran to your diet. °¨ Drink more fluids. °· Take showers instead of baths until your health care provider gives you permission to take baths. °· Do not go swimming or use a hot tub until your health care provider approves. °· Try to have someone with you or available to you the first 24-48 hours, especially if you were given a general anesthetic. °· Do not douche, use tampons, or have sex (intercourse) for 2 weeks after the procedure. °· Only take over-the-counter or prescription medicines as directed by your health care provider. Do not take aspirin. It can cause  bleeding. °· Follow up with your health care provider as directed. °SEEK MEDICAL CARE IF:  °· You have increasing cramps or pain that is not relieved with medicine. °· You have abdominal pain that does not seem to be related to the same area of earlier cramping and pain. °· You have bad smelling vaginal discharge. °· You have a rash. °· You are having problems with any medicine. °SEEK IMMEDIATE MEDICAL CARE IF:  °· You have bleeding that is heavier than a normal menstrual period. °· You have a fever. °· You have chest pain. °· You have shortness of breath. °· You feel dizzy or feel like fainting. °· You pass out. °· You have pain in your shoulder strap area. °· You have heavy vaginal bleeding with or without blood clots. °MAKE SURE YOU:  °· Understand these instructions. °· Will watch your condition. °· Will get help right away if you are not doing well or get worse. °  °This information is not intended to replace advice given to you by your health care provider. Make sure you discuss any questions you have with your health care provider. °  °Document Released: 04/02/2000 Document Revised: 04/10/2013 Document Reviewed: 11/02/2012 °Elsevier Interactive Patient Education ©2016 [employer] ° °Post Anesthesia Home Care Instructions ° °Activity: °Get plenty of rest for the remainder of the day. A responsible adult should stay with you for 24 hours following the procedure.  °For the next 24 hours, DO NOT: °-Drive a car °-Operate machinery °-Drink alcoholic beverages °-Take any medication unless   any medication unless instructed by your physician -Make any legal decisions or sign important papers.  Meals: Start with liquid foods such as gelatin or soup. Progress to regular foods as tolerated. Avoid greasy, spicy, heavy foods. If nausea and/or vomiting occur, drink only clear liquids until the nausea and/or vomiting subsides. Call your physician if vomiting continues.  Special Instructions/Symptoms: Your throat may feel dry or sore  from the anesthesia or the breathing tube placed in your throat during surgery. If this causes discomfort, gargle with warm salt water. The discomfort should disappear within 24 hours.  If you had a scopolamine patch placed behind your ear for the management of post- operative nausea and/or vomiting:  1. The medication in the patch is effective for 72 hours, after which it should be removed.  Wrap patch in a tissue and discard in the trash. Wash hands thoroughly with soap and water. 2. You may remove the patch earlier than 72 hours if you experience unpleasant side effects which may include dry mouth, dizziness or visual disturbances. 3. Avoid touching the patch. Wash your hands with soap and water after contact with the patch.

## 2016-01-08 NOTE — Anesthesia Preprocedure Evaluation (Signed)
Anesthesia Evaluation  Patient identified by MRN, date of birth, ID band Patient awake    Reviewed: Allergy & Precautions, NPO status , Patient's Chart, lab work & pertinent test results  History of Anesthesia Complications Negative for: history of anesthetic complications  Airway Mallampati: II  TM Distance: >3 FB Neck ROM: Full    Dental no notable dental hx. (+) Dental Advisory Given   Pulmonary neg pulmonary ROS, former smoker,    Pulmonary exam normal breath sounds clear to auscultation       Cardiovascular hypertension, Normal cardiovascular exam Rhythm:Regular Rate:Normal     Neuro/Psych negative neurological ROS  negative psych ROS   GI/Hepatic negative GI ROS, Neg liver ROS,   Endo/Other  negative endocrine ROS  Renal/GU negative Renal ROS  negative genitourinary   Musculoskeletal negative musculoskeletal ROS (+)   Abdominal   Peds negative pediatric ROS (+)  Hematology negative hematology ROS (+)   Anesthesia Other Findings   Reproductive/Obstetrics negative OB ROS                             Anesthesia Physical Anesthesia Plan  ASA: II  Anesthesia Plan: General   Post-op Pain Management:    Induction: Intravenous  Airway Management Planned: LMA  Additional Equipment:   Intra-op Plan:   Post-operative Plan: Extubation in OR  Informed Consent: I have reviewed the patients History and Physical, chart, labs and discussed the procedure including the risks, benefits and alternatives for the proposed anesthesia with the patient or authorized representative who has indicated his/her understanding and acceptance.   Dental advisory given  Plan Discussed with: CRNA  Anesthesia Plan Comments:         Anesthesia Quick Evaluation

## 2016-01-08 NOTE — Anesthesia Procedure Notes (Signed)
Procedure Name: LMA Insertion Date/Time: 01/08/2016 11:08 AM Performed by: Vernice Jefferson Pre-anesthesia Checklist: Patient identified, Emergency Drugs available, Suction available, Patient being monitored and Timeout performed Patient Re-evaluated:Patient Re-evaluated prior to inductionOxygen Delivery Method: Circle system utilized Preoxygenation: Pre-oxygenation with 100% oxygen Intubation Type: IV induction LMA: LMA inserted LMA Size: 4.0 Grade View: Grade II Number of attempts: 2 Placement Confirmation: positive ETCO2 and breath sounds checked- equal and bilateral Dental Injury: Teeth and Oropharynx as per pre-operative assessment

## 2016-01-09 ENCOUNTER — Encounter (HOSPITAL_COMMUNITY): Payer: Self-pay | Admitting: Obstetrics & Gynecology

## 2016-01-10 NOTE — Anesthesia Postprocedure Evaluation (Signed)
Anesthesia Post Note  Patient: Margaret Bass. [place]  Procedure(s) Performed: Procedure(s) (LRB): DILATATION & CURETTAGE/HYSTEROSCOPY WITH HYDROTHERMAL ABLATION (N/A)  Vital Signs Assessment: post-procedure vital signs reviewed and stable Anesthetic complications: no     Last Vitals:  Vitals:   01/08/16 1345 01/08/16 1415  BP: 132/81 (!) 145/83  Pulse: 79 66  Resp: 16 16  Temp: 36.4 C 36.5 C    Last Pain:  Vitals:   01/08/16 1415  TempSrc:   PainSc: 3    Pain Goal: Patients Stated Pain Goal: 3 (01/08/16 0946)               [name], Earney Navy

## 2016-02-17 ENCOUNTER — Encounter: Payer: Self-pay | Admitting: Obstetrics & Gynecology

## 2016-02-17 ENCOUNTER — Ambulatory Visit: Payer: 59 | Admitting: Obstetrics & Gynecology

## 2016-02-17 VITALS — BP 128/86 | HR 74 | Ht 66.0 in | Wt 163.0 lb

## 2016-02-17 DIAGNOSIS — Z9889 Other specified postprocedural states: Secondary | ICD-10-CM

## 2016-02-17 NOTE — Progress Notes (Signed)
   Subjective:    Patient ID: Margaret Bass, female    DOB: 07/30/1960, 55 y.o.   MRN: [id number]  HPI 55 yo MW lady now 5 weeks post op s/p d&c with HTA endometrial ablation. She denies any problems. She had just a small amount of spotting last week.   Review of Systems     Objective:   Physical Exam WNWHWFNAD Breathing, conversing, and ambulating normally       Assessment & Plan:  Postop - doing well RTC 1 year for annual exam

## 2016-04-22 ENCOUNTER — Encounter: Payer: Self-pay | Admitting: Internal Medicine

## 2016-04-22 ENCOUNTER — Telehealth: Payer: Self-pay | Admitting: Internal Medicine

## 2016-04-22 MED ORDER — SCOPOLAMINE 1 MG/3DAYS TD PT72: 1.0000 | MEDICATED_PATCH | TRANSDERMAL | 0 refills | Status: DC

## 2016-04-22 NOTE — Telephone Encounter (Signed)
Margaret Bass called because she is going on a cruise and wondered if Dr. Renold Genta would prescribe her the motion sickness patches that go behind the ear. Margaret Bass states that she has had the patches before and they work best. Margaret Bass uses the [facility] in [place] on [address]. Please advise.

## 2016-04-22 NOTE — Telephone Encounter (Signed)
Transderm scopolamine patches prescribed to pharmacy as patient requested

## 2017-10-31 ENCOUNTER — Ambulatory Visit (INDEPENDENT_AMBULATORY_CARE_PROVIDER_SITE_OTHER): Payer: 59 | Admitting: Obstetrics & Gynecology

## 2017-10-31 ENCOUNTER — Encounter: Payer: Self-pay | Admitting: Obstetrics & Gynecology

## 2017-10-31 VITALS — BP 134/81 | HR 66 | Ht 66.0 in | Wt 165.0 lb

## 2017-10-31 DIAGNOSIS — Z01419 Encounter for gynecological examination (general) (routine) without abnormal findings: Secondary | ICD-10-CM | POA: Diagnosis not present

## 2017-10-31 DIAGNOSIS — Z124 Encounter for screening for malignant neoplasm of cervix: Secondary | ICD-10-CM | POA: Diagnosis not present

## 2017-10-31 DIAGNOSIS — Z1151 Encounter for screening for human papillomavirus (HPV): Secondary | ICD-10-CM | POA: Diagnosis not present

## 2017-10-31 NOTE — Progress Notes (Signed)
Subjective:    Margaret Bass is a 57 y.o. married P3 (35, 39, and 22 yo kids,  77 yo grandson) female who presents for an annual exam. The patient has no complaints today. She is having some hot flashes, using coconut oil for lube.  The patient is sexually active. GYN screening history: last pap: was normal. The patient wears seatbelts: yes. The patient participates in regular exercise: yes. Has the patient ever been transfused or tattooed?: no. The patient reports that there is not domestic violence in her life.   Menstrual History: OB History    Gravida  6   Para  3   Term  3   Preterm      AB      Living  3     SAB      TAB      Ectopic      Multiple      Live Births  36           Menarche age: 83 Patient's last menstrual period was 12/02/2015 (approximate).    The following portions of the patient's history were reviewed and updated as appropriate: allergies, current medications, past family history, past medical history, past social history, past surgical history and problem list.  Review of Systems Pertinent items are noted in HPI.   FH- + breast cancer in maternal gm and 2 maternal aunts, first cousin- Patient's mom is BRCA negative No gyn or colon cancer Married for 24 years Works for an IT consultant No bleeding since her ablation in 2017. Gets fasting blood work with her nurse practitioner   Objective:    BP 134/81   Pulse 66   Ht 5' 6" (1.676 m)   Wt 165 lb (74.8 kg)   LMP 12/02/2015 (Approximate)   BMI 26.63 kg/m   General Appearance:    Alert, cooperative, no distress, appears stated age  Head:    Normocephalic, without obvious abnormality, atraumatic  Eyes:    PERRL, conjunctiva/corneas clear, EOM's intact, fundi    benign, both eyes  Ears:    Normal TM's and external ear canals, both ears  Nose:   Nares normal, septum midline, mucosa normal, no drainage    or sinus tenderness  Throat:   Lips, mucosa, and tongue normal; teeth and gums  normal  Neck:   Supple, symmetrical, trachea midline, no adenopathy;    thyroid:  no enlargement/tenderness/nodules; no carotid   bruit or JVD  Back:     Symmetric, no curvature, ROM normal, no CVA tenderness  Lungs:     Clear to auscultation bilaterally, respirations unlabored  Chest Wall:    No tenderness or deformity   Heart:    Regular rate and rhythm, S1 and S2 normal, no murmur, rub   or gallop  Breast Exam:    No tenderness, masses, or nipple abnormality  Abdomen:     Soft, non-tender, bowel sounds active all four quadrants,    no masses, no organomegaly  Genitalia:    Normal female without lesion, discharge or tenderness, normal size and shape, anteverted, mobile, non-tender, normal adnexal exam      Extremities:   Extremities normal, atraumatic, no cyanosis or edema  Pulses:   2+ and symmetric all extremities  Skin:   Skin color, texture, turgor normal, no rashes or lesions  Lymph nodes:   Cervical, supraclavicular, and axillary nodes normal  Neurologic:   CNII-XII intact, normal strength, sensation and reflexes    throughout  .  Assessment:    Healthy female exam.    Plan:     Thin prep Pap smear. with cotesting

## 2017-10-31 NOTE — Progress Notes (Signed)
Last pap  2/17- Normal Last mammogram- Pt states in 2017- Has one scheduled for next week

## 2017-11-02 LAB — CYTOLOGY - PAP
DIAGNOSIS: NEGATIVE
HPV: NOT DETECTED

## 2019-07-07 ENCOUNTER — Ambulatory Visit: Payer: 59

## 2019-07-07 ENCOUNTER — Ambulatory Visit: Payer: 59 | Attending: Internal Medicine

## 2019-07-07 DIAGNOSIS — Z23 Encounter for immunization: Secondary | ICD-10-CM

## 2019-07-07 NOTE — Progress Notes (Signed)
   Covid-19 Vaccination Clinic  Name:  Margaret Bass    MRN: [id number] DOB: [birth year]  07/07/2019  Margaret Bass was observed post Covid-19 immunization for 15 minutes without incident. She was provided with Vaccine Information Sheet and instruction to access the V-Safe system.   Margaret Bass was instructed to call 911 with any severe reactions post vaccine: Marland Kitchen Difficulty breathing  . Swelling of face and throat  . A fast heartbeat  . A bad rash all over body  . Dizziness and weakness   Immunizations Administered    Name Date Dose VIS Date Route   Moderna COVID-19 Vaccine 07/07/2019 10:25 AM 0.5 mL 03/20/2019 Intramuscular   Manufacturer: Moderna   Lot: [id number]   Cesar [id number]

## 2019-08-08 ENCOUNTER — Ambulatory Visit: Payer: 59 | Attending: Internal Medicine

## 2019-08-08 DIAGNOSIS — Z23 Encounter for immunization: Secondary | ICD-10-CM

## 2019-08-08 NOTE — Progress Notes (Signed)
   Covid-19 Vaccination Clinic  Name:  Margaret Bass. [place]    MRN: [id number] DOB: [birth year]  08/08/2019  [name] was observed post Covid-19 immunization for 15 minutes without incident. She was provided with Vaccine Information Sheet and instruction to access the V-Safe system.   [name] was instructed to call 911 with any severe reactions post vaccine: Marland Kitchen Difficulty breathing  . Swelling of face and throat  . A fast heartbeat  . A bad rash all over body  . Dizziness and weakness   Immunizations Administered    Name Date Dose VIS Date Route   Moderna COVID-19 Vaccine 08/08/2019  3:29 PM 0.5 mL 03/2019 Intramuscular   Manufacturer: Moderna   Lot: [id number]   [id number]

## 2020-10-30 ENCOUNTER — Ambulatory Visit: Payer: 59 | Admitting: Obstetrics and Gynecology

## 2020-12-03 ENCOUNTER — Ambulatory Visit (INDEPENDENT_AMBULATORY_CARE_PROVIDER_SITE_OTHER): Payer: 59 | Admitting: Obstetrics & Gynecology

## 2020-12-03 ENCOUNTER — Encounter: Payer: Self-pay | Admitting: Obstetrics & Gynecology

## 2020-12-03 ENCOUNTER — Other Ambulatory Visit: Payer: Self-pay

## 2020-12-03 ENCOUNTER — Other Ambulatory Visit (HOSPITAL_COMMUNITY)
Admission: RE | Admit: 2020-12-03 | Discharge: 2020-12-03 | Disposition: A | Discharge status: Home / Self Care | Payer: 59 | Source: Ambulatory Visit | Attending: Obstetrics & Gynecology | Admitting: Obstetrics & Gynecology

## 2020-12-03 VITALS — BP 121/73 | HR 71 | Ht 66.0 in | Wt 163.0 lb

## 2020-12-03 DIAGNOSIS — Z01419 Encounter for gynecological examination (general) (routine) without abnormal findings: Secondary | ICD-10-CM | POA: Insufficient documentation

## 2020-12-03 DIAGNOSIS — R1031 Right lower quadrant pain: Secondary | ICD-10-CM

## 2020-12-03 NOTE — Progress Notes (Signed)
Mam scheduled for two weeks from now PHQ Score 4 GAD Score 4

## 2020-12-04 LAB — CYTOLOGY - PAP
Comment: NEGATIVE
Diagnosis: NEGATIVE
High risk HPV: NEGATIVE

## 2020-12-05 ENCOUNTER — Ambulatory Visit (INDEPENDENT_AMBULATORY_CARE_PROVIDER_SITE_OTHER): Payer: 59

## 2020-12-05 ENCOUNTER — Other Ambulatory Visit: Payer: Self-pay

## 2020-12-05 DIAGNOSIS — Z9889 Other specified postprocedural states: Secondary | ICD-10-CM

## 2020-12-05 DIAGNOSIS — Z86018 Personal history of other benign neoplasm: Secondary | ICD-10-CM | POA: Diagnosis not present

## 2020-12-05 DIAGNOSIS — R1031 Right lower quadrant pain: Secondary | ICD-10-CM | POA: Diagnosis not present

## 2020-12-07 NOTE — Progress Notes (Signed)
Subjective:     Margaret Bass is a 60 y.o. female here for a routine exam.  Current complaints: several months of pelvic cramping.  NO bleeding.  No changes in bowel or bladder habits.  Pt recently took care of mother who died on [facility].  She is compint with loss well.  She is also working through issues with her siblings around her mother's death.  No complaints of depression or anxiety.   Gynecologic History Patient's last menstrual period was 12/26/2015 (approximate). Contraception: Menopause Last Pap: 2019. Results were: normal Last mammogram: 2021. Results were: normal per patient.  They were completed at [facility] states the the attachment is not available for me to view.    Obstetric History OB History  Gravida Para Term Preterm AB Living  '6 3 3     3  '$ SAB IAB Ectopic Multiple Live Births          3    # Outcome Date GA Lbr Len/2nd Weight Sex Delivery Anes PTL Lv  6 Term           5 Term           4 Term           3 Gravida      Vag-Spont   LIV  2 Gravida      Vag-Spont   LIV  1 Gravida      Vag-Spont   LIV     The following portions of the patient's history were reviewed and updated as appropriate: allergies, current medications, past family history, past medical history, past social history, past surgical history, and problem list.  Review of Systems Pertinent items noted in HPI and remainder of comprehensive ROS otherwise negative.    Objective:     Vitals:   12/03/20 1458  BP: 121/73  Pulse: 71  Weight: 163 lb (73.9 kg)  Height: '5\' 6"'$  (1.676 m)   Vitals:  WNL General appearance: alert, cooperative and no distress  HEENT: Normocephalic, without obvious abnormality, atraumatic Eyes: negative Throat: lips, mucosa, and tongue normal; teeth and gums normal  Respiratory: Clear to auscultation bilaterally  CV: Regular rate and rhythm  Breasts:  Normal appearance, no masses or tenderness, no nipple retraction or dimpling  GI: Soft, non-tender;  bowel sounds normal; no masses,  no organomegaly  GU: External Genitalia:  Tanner V, no lesion Urethra:  No prolapse   Vagina: Pink, normal rugae, no blood or discharge  Cervix: No CMT, no lesion  Uterus:  Normal size and contour, non tender  Adnexa: Normal, no masses, mildly tender in right adnexa  Musculoskeletal: No edema, redness or tenderness in the calves or thighs  Skin: No lesions or rash  Lymphatic: Axillary adenopathy: none     Psychiatric: Normal mood and behavior   Assessment:    Healthy female exam.  Right lower quadrant pain for several months   Plan:    Pap with cotesting Yearly mammograms Calcium and Vit D counseling PHQ 9 and GAD are normal Pelvic US complete for right lower quadrant tenderness and pelvic pain.

## 2020-12-14 ENCOUNTER — Other Ambulatory Visit: Payer: Self-pay | Admitting: Obstetrics & Gynecology

## 2020-12-14 DIAGNOSIS — R102 Pelvic and perineal pain: Secondary | ICD-10-CM

## 2020-12-15 ENCOUNTER — Other Ambulatory Visit: Payer: Self-pay

## 2020-12-15 ENCOUNTER — Encounter: Payer: Self-pay | Admitting: Obstetrics & Gynecology

## 2020-12-15 ENCOUNTER — Telehealth (INDEPENDENT_AMBULATORY_CARE_PROVIDER_SITE_OTHER): Payer: 59 | Admitting: Obstetrics & Gynecology

## 2020-12-15 DIAGNOSIS — R102 Pelvic and perineal pain: Secondary | ICD-10-CM

## 2020-12-15 DIAGNOSIS — R1031 Right lower quadrant pain: Secondary | ICD-10-CM

## 2020-12-15 NOTE — Progress Notes (Signed)
GYNECOLOGY VIRTUAL VISIT ENCOUNTER NOTE  Provider location: [facility] [place]   Patient location: Home  I connected with Gilman Buttner on 12/15/20 at  2:30 PM EDT by MyChart Video Encounter and verified that I am speaking with the correct person using two identifiers.   I discussed the limitations, risks, security and privacy concerns of performing an evaluation and management service virtually and the availability of in person appointments. I also discussed with the patient that there may be a patient responsible charge related to this service. The patient expressed understanding and agreed to proceed.   History:  Margaret Bass is a 60 y.o. [phone] female being evaluated today f/u  Of ultrasound.  Her right lower quadrant pain is still present.  The right ovary was obscured by gas.  We discussed proceeding with CT scan if the ultrasound was inconclusive.  The patient desires to have the CT scan ordered which has been ordered.  It will be with IV and oral contrast.   Past Medical History:  Diagnosis Date   Heart palpitations    Insomnia    Irregular heart rate 07/29/2011   ER stated that it was a virus   Seasonal allergies    SVD (spontaneous vaginal delivery)    x 3   Past Surgical History:  Procedure Laterality Date   CHOLECYSTECTOMY     colonoscopy     DILITATION & CURRETTAGE/HYSTROSCOPY WITH HYDROTHERMAL ABLATION N/A 01/08/2016   Procedure: DILATATION & CURETTAGE/HYSTEROSCOPY WITH HYDROTHERMAL ABLATION;  Surgeon: Emily Filbert, MD;  Location: [facility];  Service: Gynecology;  Laterality: N/A;   lipoma removal     TONSILLECTOMY     WISDOM TOOTH EXTRACTION     The following portions of the patient's history were reviewed and updated as appropriate: allergies, current medications, past family history, past medical history, past social history, past surgical history and problem list.    Review of Systems:  Pertinent items noted in HPI and remainder of  comprehensive ROS otherwise negative.  Physical Exam:   General:  Alert, oriented and cooperative. Patient appears to be in no acute distress.  Mental Status: Normal mood and affect. Normal behavior. Normal judgment and thought content.   Respiratory: Normal respiratory effort, no problems with respiration noted  Rest of physical exam deferred due to type of encounter  Labs and Imaging Results for orders placed or performed in visit on 12/03/20 (from the past 336 hour(s))  Cytology - PAP( [facility])   Collection Time: 12/03/20  3:36 PM  Result Value Ref Range   High risk HPV Negative    Adequacy      Satisfactory for evaluation; transformation zone component PRESENT.   Diagnosis      - Negative for intraepithelial lesion or malignancy (NILM)   Comment Normal Reference Range HPV - Negative    US PELVIC COMPLETE WITH TRANSVAGINAL  Result Date: 12/05/2020 CLINICAL DATA:  RIGHT lower quadrant abdominal pain, past history of D&C, endometrial ablation, fibroids EXAM: TRANSABDOMINAL AND TRANSVAGINAL ULTRASOUND OF PELVIS TECHNIQUE: Both transabdominal and transvaginal ultrasound examinations of the pelvis were performed. Transabdominal technique was performed for global imaging of the pelvis including uterus, ovaries, adnexal regions, and pelvic cul-de-sac. It was necessary to proceed with endovaginal exam following the transabdominal exam to visualize the endometrium and ovaries. COMPARISON:  08/22/2014 FINDINGS: Uterus Measurements: 4.2 x 3.3 x 4.6 cm = volume: 33 mL. Anteverted. Heterogeneous myometrium. Anterior wall submucosal leiomyoma 2.5 x 2.0 x 2.4 cm.  Small posterior wall upper uterine leiomyoma, submucosal, 1.0 x 0.7 x 1.1 cm. No additional masses. Endometrium Thickness: 2 mm.  No endometrial fluid or focal abnormality Right ovary Not visualized, likely obscured by bowel Left ovary Measurements: 2.6 x 1.4 x 1.2 cm = volume: 2 mL. Normal morphology without mass Other findings No free  pelvic fluid.  No adnexal masses. IMPRESSION: Two submucosal uterine leiomyomata, larger measuring 2.5 cm greatest size. Nonvisualization of RIGHT ovary. Electronically Signed   By: Lavonia Dana M.D.   On: 12/05/2020 11:43       Assessment and Plan:  60 year old female with right lower quadrant pain and unable to visualize the right ovary on ultrasound.   Pelvic CT with IV and p.o. contrast ordered.  I discussed the assessment and treatment plan with the patient. The patient was provided an opportunity to ask questions and all were answered. The patient agreed with the plan and demonstrated an understanding of the instructions.   The patient was advised to call back or seek an in-person evaluation/go to the ED if the symptoms worsen or if the condition fails to improve as anticipated.  I provided 15 minutes of care for patient during the video visit, review of medical record, and documentation.   Silas Sacramento, MD [facility], [place]

## 2020-12-30 ENCOUNTER — Other Ambulatory Visit: Payer: Self-pay

## 2020-12-30 ENCOUNTER — Ambulatory Visit (INDEPENDENT_AMBULATORY_CARE_PROVIDER_SITE_OTHER): Payer: 59

## 2020-12-30 DIAGNOSIS — R102 Pelvic and perineal pain: Secondary | ICD-10-CM | POA: Diagnosis not present

## 2020-12-30 MED ORDER — IOHEXOL 300 MG/ML  SOLN
100.0000 mL | Freq: Once | INTRAMUSCULAR | Status: AC | PRN
  Administered 2020-12-30: 100 mL via INTRAVENOUS

## 2021-01-01 ENCOUNTER — Other Ambulatory Visit: Payer: 59

## 2021-02-24 ENCOUNTER — Telehealth: Payer: Self-pay | Admitting: *Deleted

## 2021-02-24 NOTE — Telephone Encounter (Signed)
Pt called stating that she is in menopause but when she had intercourse last night she did have some vaginal bleeding.  She stated that intercourse was a little uncomfortable.  I explained that bleeding in a post menopausal women is not normal and should be evaluated.  Appt made with Dr Damita Dunnings for next week.

## 2021-02-28 NOTE — Progress Notes (Deleted)
   GYNECOLOGY OFFICE VISIT NOTE  History:   Margaret Bass is a 60 y.o. [phone] here today for bleeding after intercourse after one episode.   She reported discomfort with the intercourse.   She had an [place] in August for pelvic pain that was notable for a 2 mm lining. She does have a history of an ablation.    She denies any abnormal vaginal discharge, pelvic pain or other concerns.     Past Medical History:  Diagnosis Date   Heart palpitations    Insomnia    Irregular heart rate 07/29/2011   ER stated that it was a virus   Seasonal allergies    SVD (spontaneous vaginal delivery)    x 3    Past Surgical History:  Procedure Laterality Date   CHOLECYSTECTOMY     colonoscopy     DILITATION & CURRETTAGE/HYSTROSCOPY WITH HYDROTHERMAL ABLATION N/A 01/08/2016   Procedure: DILATATION & CURETTAGE/HYSTEROSCOPY WITH HYDROTHERMAL ABLATION;  Surgeon: Emily Filbert, MD;  Location: [facility];  Service: Gynecology;  Laterality: N/A;   lipoma removal     TONSILLECTOMY     WISDOM TOOTH EXTRACTION      The following portions of the patient's history were reviewed and updated as appropriate: allergies, current medications, past family history, past medical history, past social history, past surgical history and problem list.   Health Maintenance:   Diagnosis  Date Value Ref Range Status  12/03/2020   Final   - Negative for intraepithelial lesion or malignancy (NILM)   HPV negative  Mammogram done on 01/2021 with [facility]. Report not available.   Review of Systems:  Pertinent items noted in HPI and remainder of comprehensive ROS otherwise negative.  Physical Exam:  LMP 12/26/2015 (Approximate)  CONSTITUTIONAL: Well-developed, well-nourished female in no acute distress.  HEENT:  Normocephalic, atraumatic. External right and left ear normal. No scleral icterus.  NECK: Normal range of motion, supple, no masses noted on observation SKIN: No rash noted. Not diaphoretic. No erythema. No  pallor. MUSCULOSKELETAL: Normal range of motion. No edema noted. NEUROLOGIC: Alert and oriented to person, place, and time. Normal muscle tone coordination. No cranial nerve deficit noted. PSYCHIATRIC: Normal mood and affect. Normal behavior. Normal judgment and thought content.  CARDIOVASCULAR: Normal heart rate noted RESPIRATORY: Effort and breath sounds normal, no problems with respiration noted ABDOMEN: No masses noted. No other overt distention noted.    PELVIC: {Blank single:19197::"Deferred","Normal appearing external genitalia; normal urethral meatus; normal appearing vaginal mucosa and cervix.  No abnormal discharge noted.  Normal uterine size, no other palpable masses, no uterine or adnexal tenderness. Performed in the presence of a chaperone"}  Labs and Imaging 12/05/20 - TVUS showing 2 mm lining; uterus 4-5 cm with two small submucosal fibroids.   Assessment and Plan:    1. PCB (post coital bleeding) - Pap normal, recent US normal in the last 3 months in s/o prior ablation - Discussed option for EMB today and offered it.  - We discussed the other option to help with pain with intercourse as bleeding is most likely from atrophy. We reviewed Vag E, replens, silicone based lubricants. She would like ***   Routine preventative health maintenance measures emphasized. Please refer to After Visit Summary for other counseling recommendations.   No follow-ups on file.    Radene Gunning, MD, [place] for [facility], [place]

## 2021-03-05 ENCOUNTER — Ambulatory Visit: Payer: 59 | Admitting: Obstetrics and Gynecology

## 2021-03-05 DIAGNOSIS — N930 Postcoital and contact bleeding: Secondary | ICD-10-CM

## 2023-03-30 IMAGING — US US PELVIS COMPLETE WITH TRANSVAGINAL
1 series · 13 of 25 positions shown · non-contrast
Comparison: 08/22/2014

CLINICAL DATA: RIGHT lower quadrant abdominal pain, past history of
D&C, endometrial ablation, fibroids

EXAM:
TRANSABDOMINAL AND TRANSVAGINAL ULTRASOUND OF PELVIS
TECHNIQUE: Both transabdominal and transvaginal ultrasound examinations of the
pelvis were performed. Transabdominal technique was performed for
global imaging of the pelvis including uterus, ovaries, adnexal
regions, and pelvic cul-de-sac. It was necessary to proceed with
endovaginal exam following the transabdominal exam to visualize the
endometrium and ovaries.

[Series 1: us pelvic complete with transvaginal · 13 of 79 slices shown]
[im 1/79]
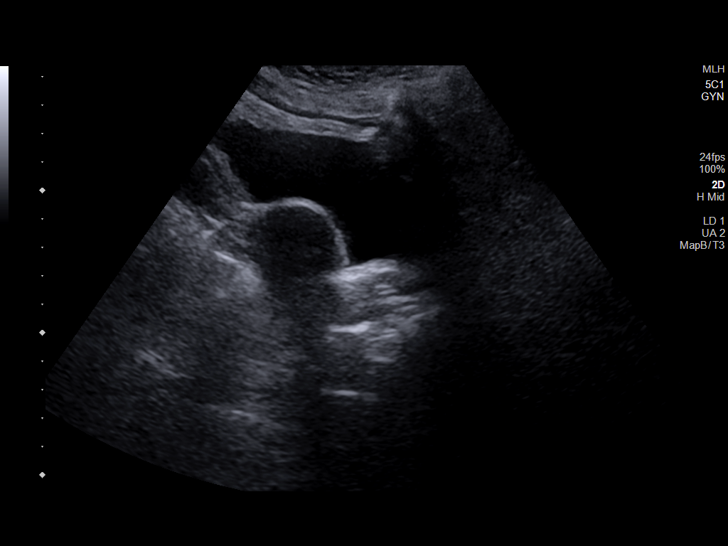
[im 7/79]
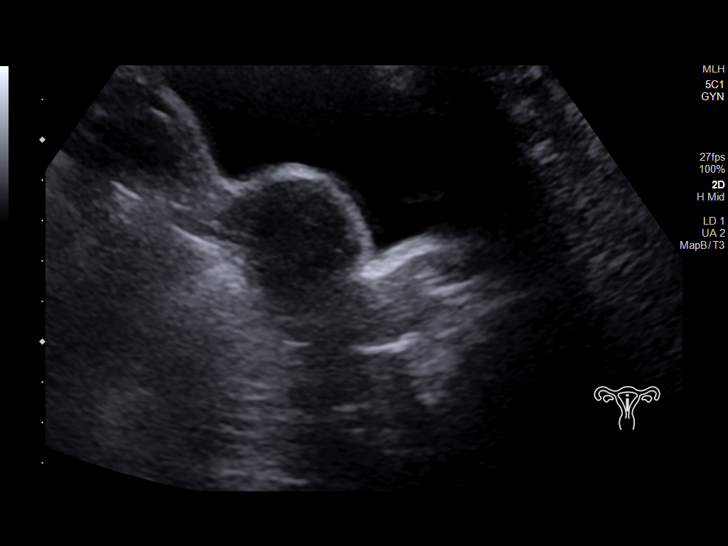
[im 14/79]
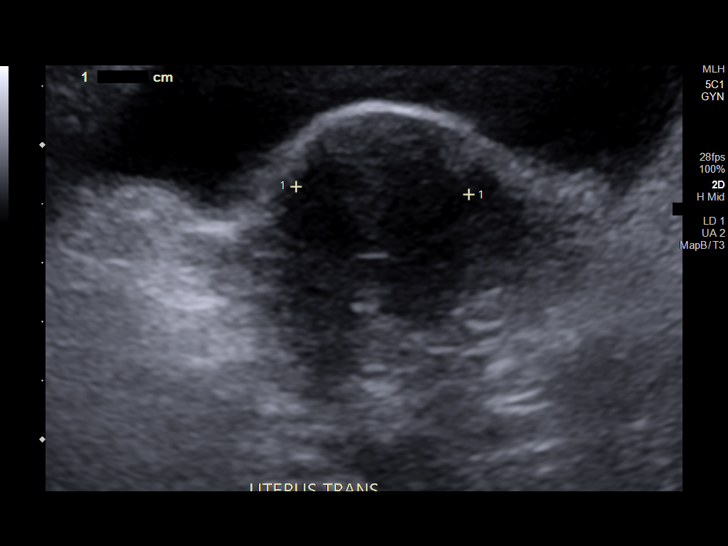
[im 20/79]
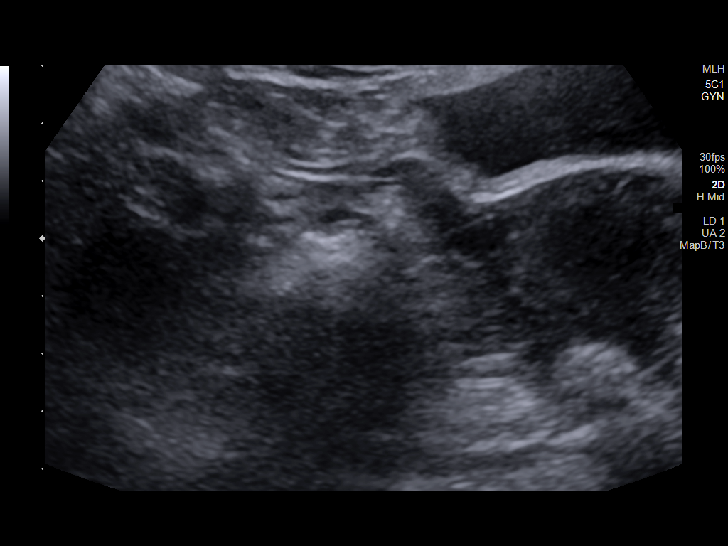
[im 27/79]
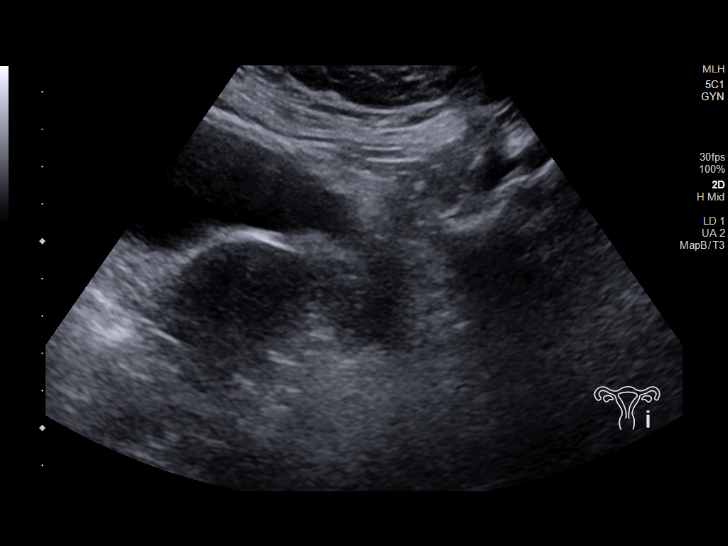
[im 33/79]
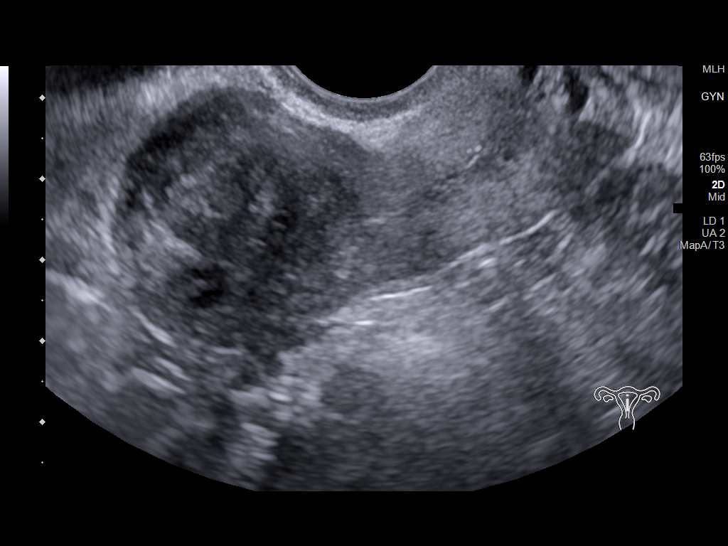
[im 40/79]
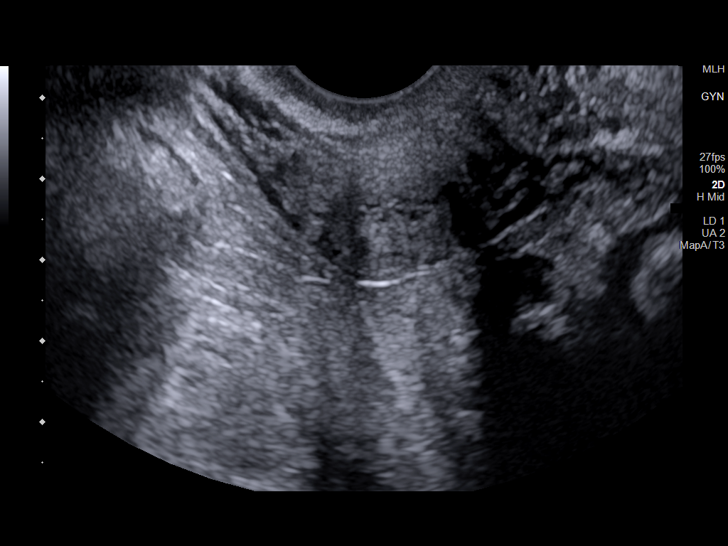
[im 46/79]
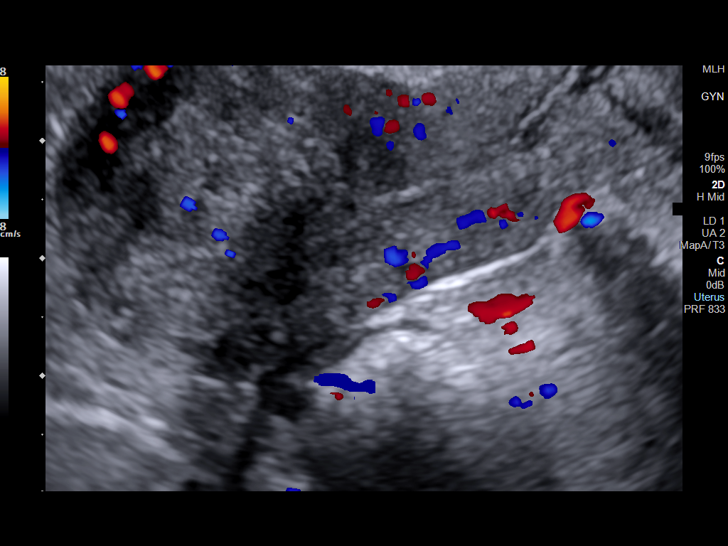
[im 53/79]
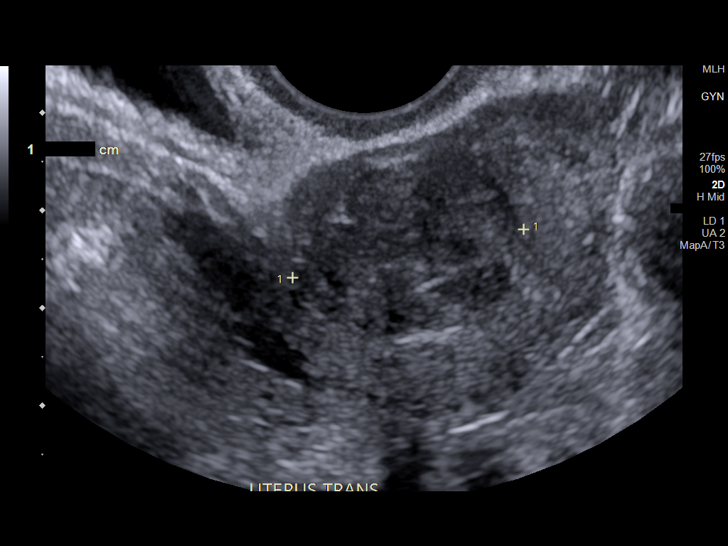
[im 59/79]
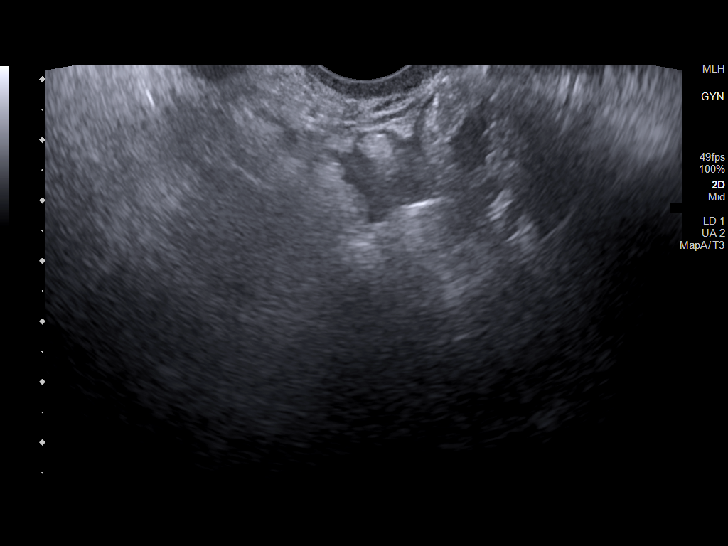
[im 66/79]
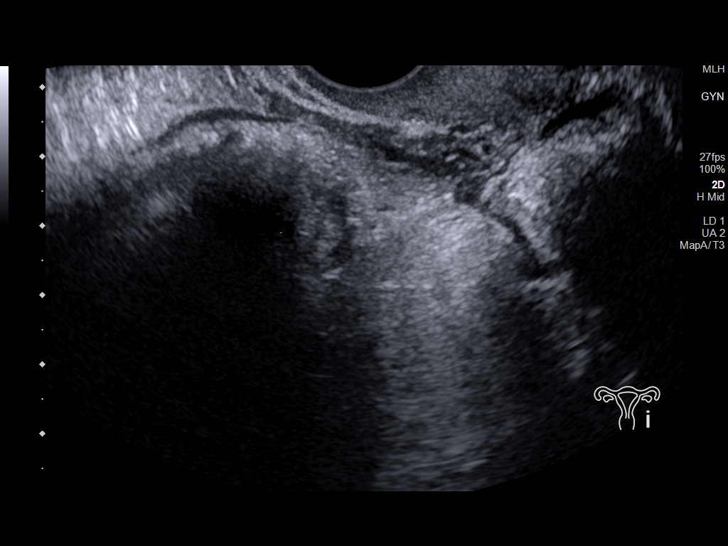
[im 72/79]
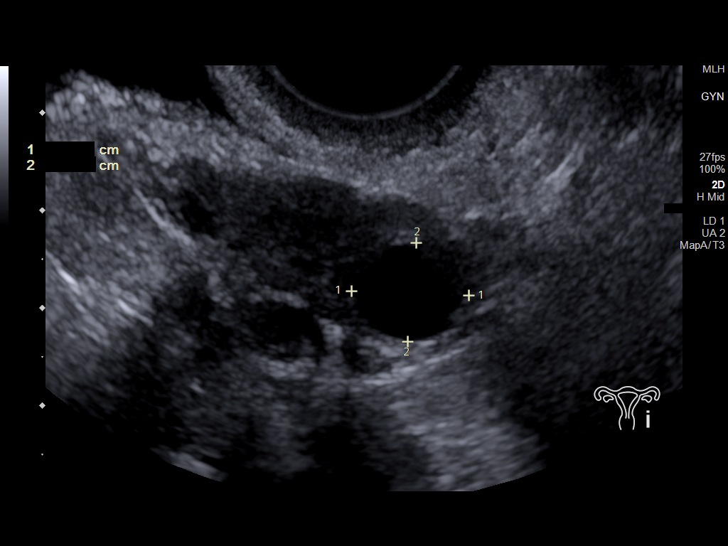
[im 79/79]
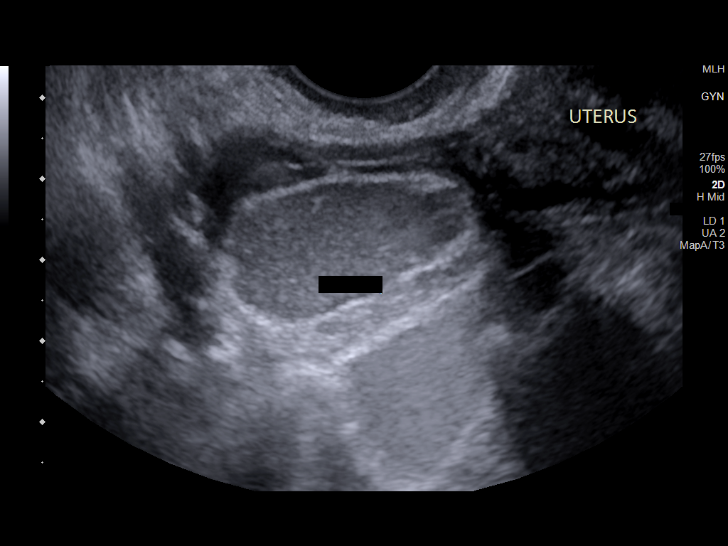

[13 of 25 positions shown; findings below may reference images not displayed]

FINDINGS: Uterus

Measurements: 4.2 x 3.3 x 4.6 cm = volume: 33 mL. Anteverted.
Heterogeneous myometrium. Anterior wall submucosal leiomyoma 2.5 x
2.0 x 2.4 cm. Small posterior wall upper uterine leiomyoma,
submucosal, 1.0 x 0.7 x 1.1 cm. No additional masses.

Endometrium

Thickness: 2 mm.  No endometrial fluid or focal abnormality

Right ovary

Not visualized, likely obscured by bowel

Left ovary

Measurements: 2.6 x 1.4 x 1.2 cm = volume: 2 mL. Normal morphology
without mass

Other findings

No free pelvic fluid.  No adnexal masses.
IMPRESSION: Two submucosal uterine leiomyomata, larger measuring 2.5 cm greatest
size.

Nonvisualization of RIGHT ovary.

## 2023-04-24 IMAGING — CT CT PELVIS W/ CM
2 of 4 series · 17 of 46 positions shown, 19 images · IV contrast (APPLIED)
Comparison: Ultrasound December 05, 2020

CLINICAL DATA: Postmenopausal pelvic pain.

EXAM:
CT PELVIS WITH CONTRAST
TECHNIQUE: Multidetector CT imaging of the pelvis was performed using the
standard protocol following the bolus administration of intravenous
contrast.
CONTRAST:  100mL OMNIPAQUE IOHEXOL 300 MG/ML  SOLN

[Series 2: axial st · axial · 0.70mm/px · z∈[-369,-94]mm · 14 of 63 slices shown, 16 images]
[im 4/63  soft-tissue]
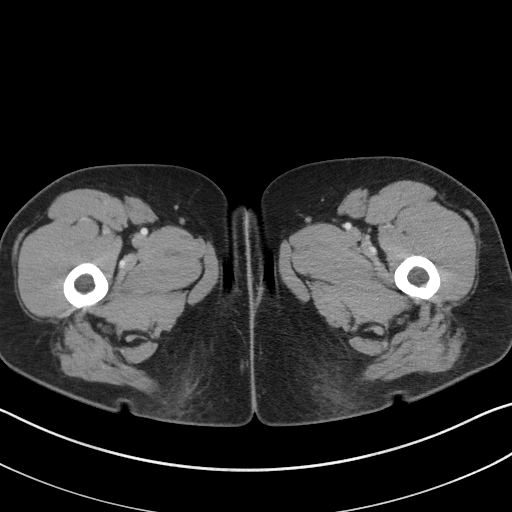
[im 4/63  bone]
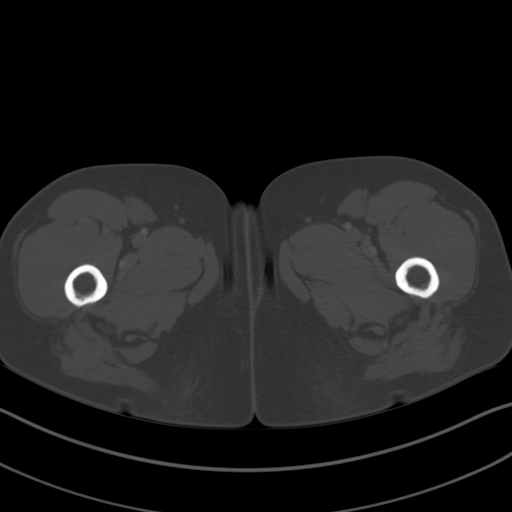
[im 8/63  soft-tissue]
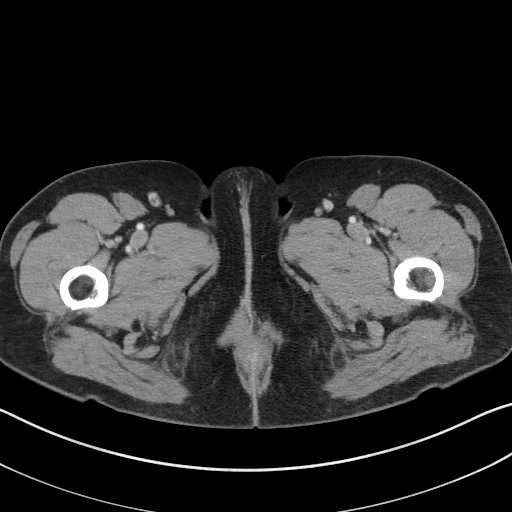
[im 12/63  soft-tissue]
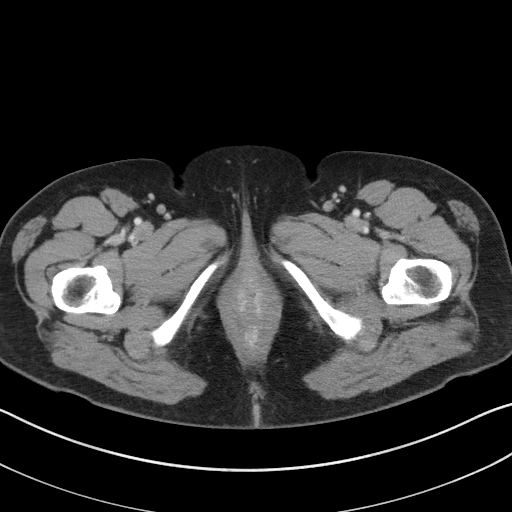
[im 16/63  soft-tissue]
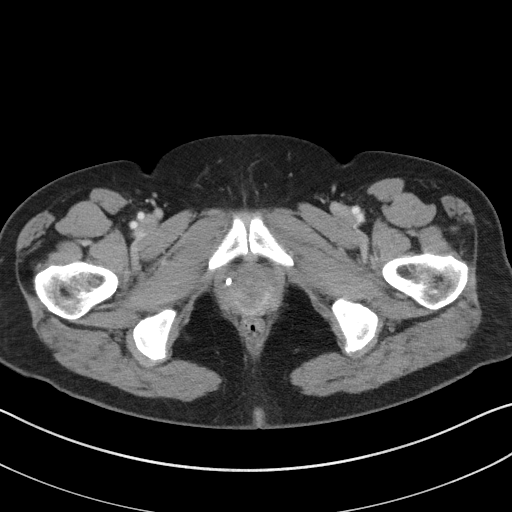
[im 20/63  soft-tissue]
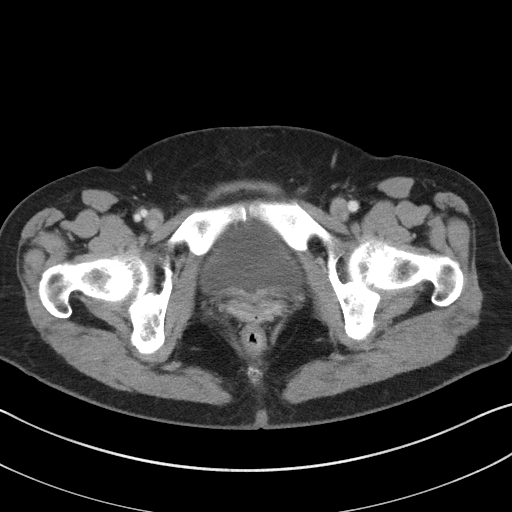
[im 24/63  soft-tissue]
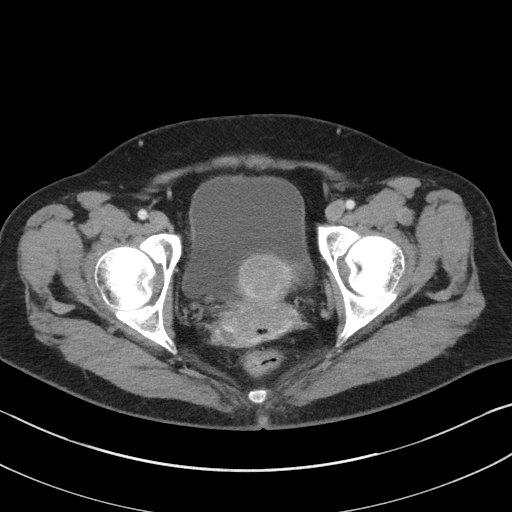
[im 28/63  soft-tissue]
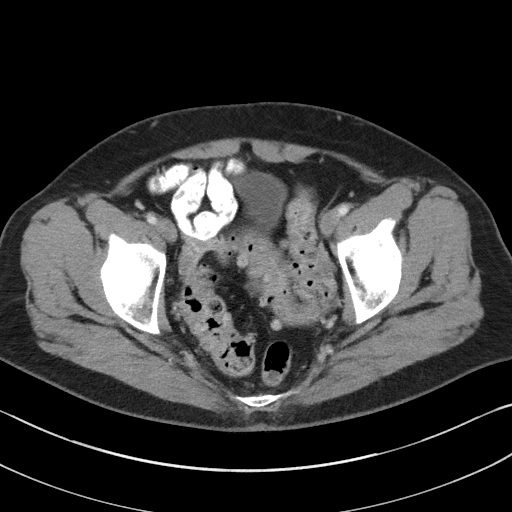
[im 35/63  soft-tissue]
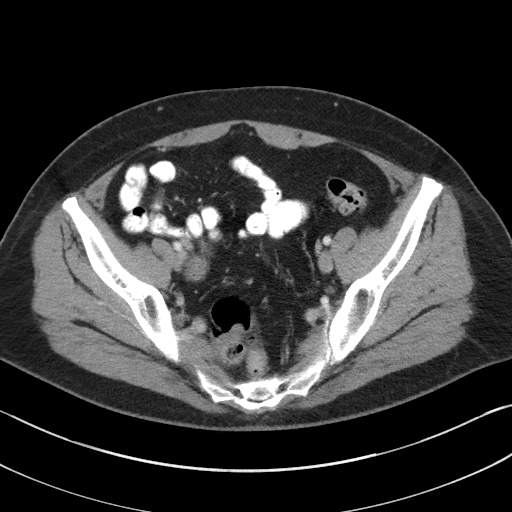
[im 39/63  soft-tissue]
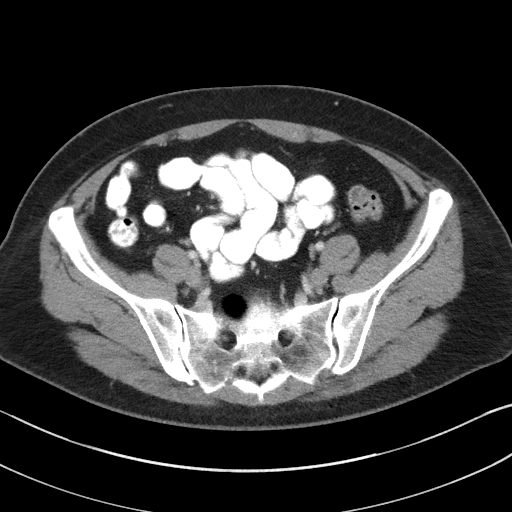
[im 39/63  bone]
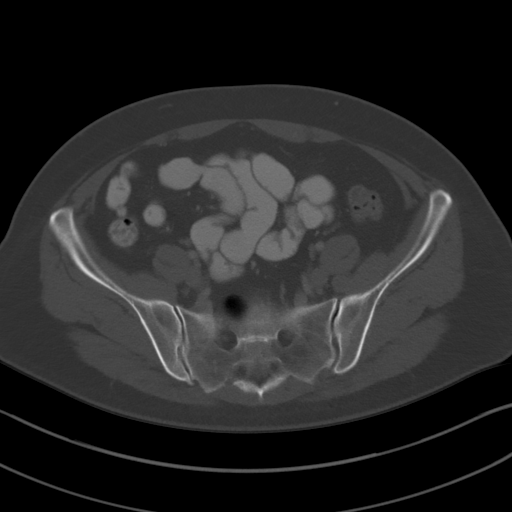
[im 43/63  soft-tissue]
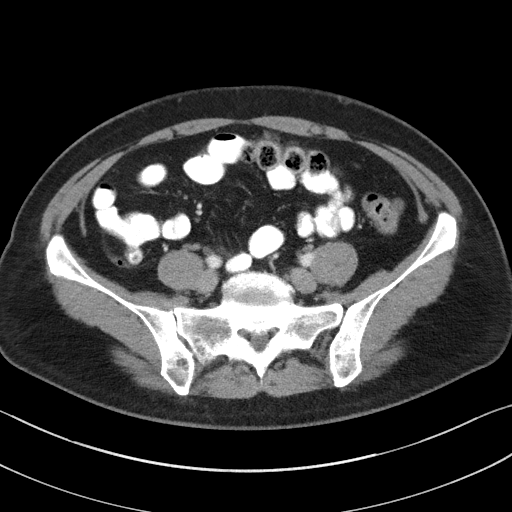
[im 47/63  soft-tissue]
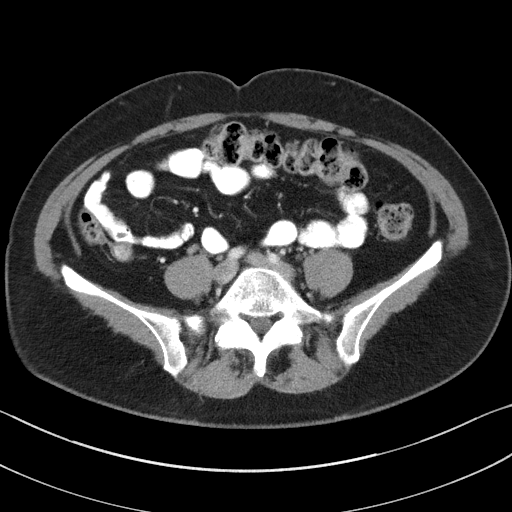
[im 51/63  soft-tissue]
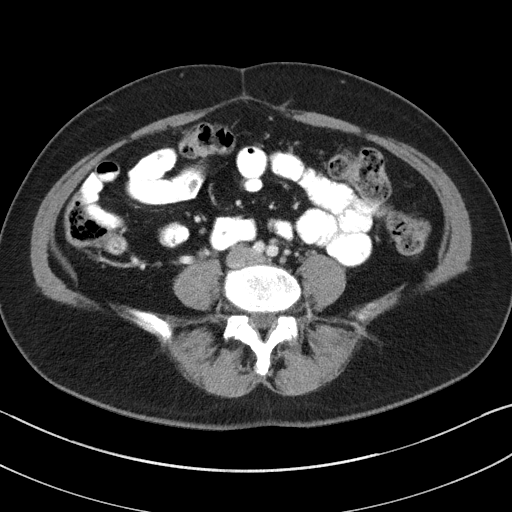
[im 55/63  soft-tissue]
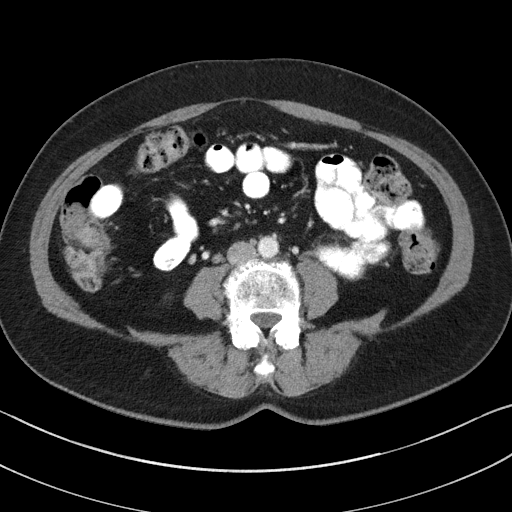
[im 59/63  soft-tissue]
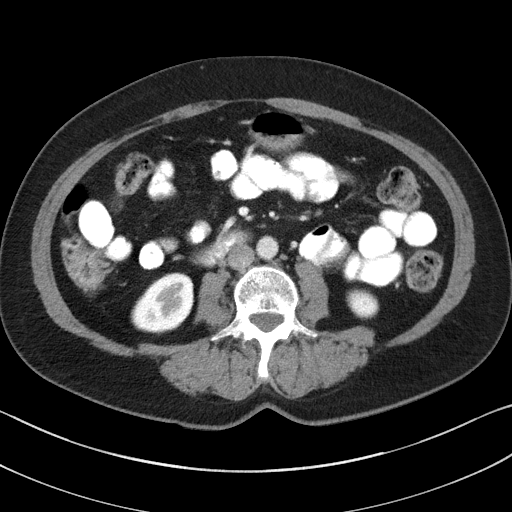

[Series 4: coronal st · coronal · 0.64mm/px · 3 of 80 slices shown]
[im 27/80  soft-tissue]
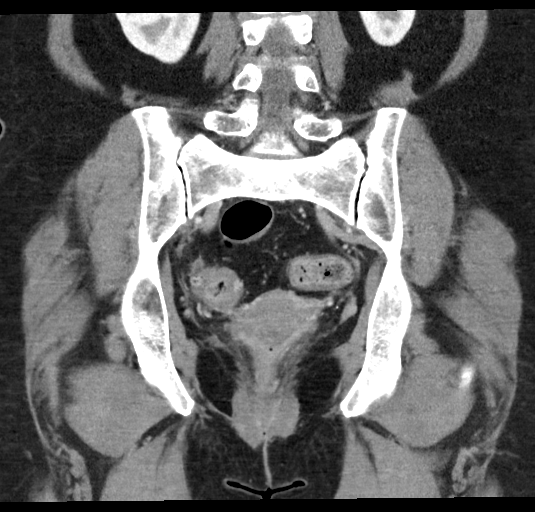
[im 36/80  soft-tissue]
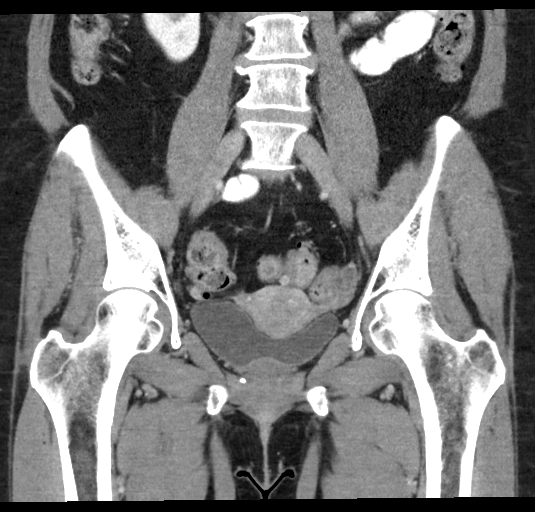
[im 44/80  soft-tissue]
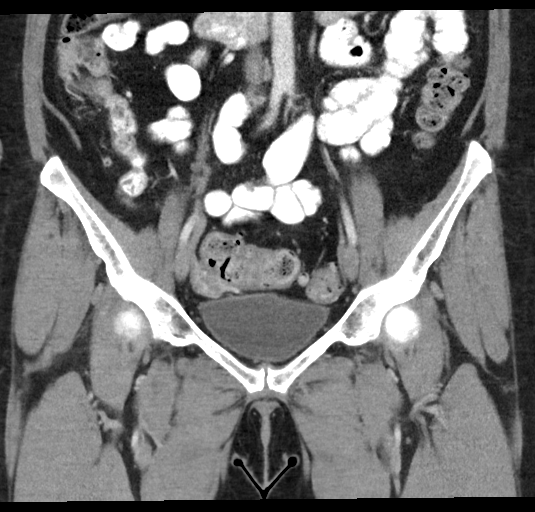

[17 of 46 positions shown; findings below may reference images not displayed]

FINDINGS: Urinary Tract: Urinary bladder is unremarkable for degree of
distension. No hydroureter.

Bowel: Appendix and terminal ileum appear normal. No pathologic
dilation of small or large bowel. Colonic diverticulosis without
findings of acute diverticulitis.

Vascular/Lymphatic: No significant vascular finding. No
pathologically enlarged abdominal or pelvic lymph nodes.

Reproductive: Leiomyomatous uterus previously characterized on
ultrasound December 05, 2020. No suspicious adnexal masses.

Other:  No significant pelvic free fluid.

Musculoskeletal: No acute or significant osseous finding.
IMPRESSION: 1. No acute findings within the pelvis.
2. Leiomyomatous uterus previously characterized on pelvic
ultrasound December 05, 2020.
3. Colonic diverticulosis without findings of acute diverticulitis.
